# Patient Record
Sex: Female | Born: 1945 | Race: White | Hispanic: No
Health system: Southern US, Community
[De-identification: ages and names within clinical notes are randomized; demographics above are authoritative.]

## PROBLEM LIST (undated history)

## (undated) DIAGNOSIS — I4891 Unspecified atrial fibrillation: Secondary | ICD-10-CM

## (undated) DIAGNOSIS — J449 Chronic obstructive pulmonary disease, unspecified: Secondary | ICD-10-CM

## (undated) DIAGNOSIS — I10 Essential (primary) hypertension: Secondary | ICD-10-CM

## (undated) DIAGNOSIS — J441 Chronic obstructive pulmonary disease with (acute) exacerbation: Secondary | ICD-10-CM

## (undated) DIAGNOSIS — M797 Fibromyalgia: Secondary | ICD-10-CM

## (undated) DIAGNOSIS — M549 Dorsalgia, unspecified: Secondary | ICD-10-CM

## (undated) DIAGNOSIS — K219 Gastro-esophageal reflux disease without esophagitis: Secondary | ICD-10-CM

## (undated) DIAGNOSIS — E039 Hypothyroidism, unspecified: Secondary | ICD-10-CM

## (undated) HISTORY — PX: HYSTERECTOMY: SHX81

## (undated) HISTORY — PX: BACK SURGERY: SHX140

## (undated) HISTORY — DX: Fibromyalgia: M79.7

## (undated) HISTORY — DX: Dorsalgia, unspecified: M54.9

## (undated) HISTORY — PX: NECK SURGERY: SHX720

## (undated) HISTORY — PX: THYROID SURGERY: SHX805

## (undated) HISTORY — DX: Chronic obstructive pulmonary disease with (acute) exacerbation: J44.1

## (undated) HISTORY — DX: Essential (primary) hypertension: I10

## (undated) HISTORY — DX: Hypothyroidism, unspecified: E03.9

## (undated) HISTORY — PX: CHOLECYSTECTOMY: SHX55

## (undated) HISTORY — PX: OTHER SURGICAL HISTORY: SHX169

## (undated) HISTORY — DX: Gastro-esophageal reflux disease without esophagitis: K21.9

---

## 2015-07-03 DIAGNOSIS — M79641 Pain in right hand: Secondary | ICD-10-CM | POA: Diagnosis not present

## 2015-07-03 DIAGNOSIS — M79642 Pain in left hand: Secondary | ICD-10-CM | POA: Diagnosis not present

## 2015-07-03 DIAGNOSIS — R69 Illness, unspecified: Secondary | ICD-10-CM | POA: Diagnosis not present

## 2015-07-03 DIAGNOSIS — M7071 Other bursitis of hip, right hip: Secondary | ICD-10-CM | POA: Diagnosis not present

## 2015-07-03 DIAGNOSIS — M7072 Other bursitis of hip, left hip: Secondary | ICD-10-CM | POA: Diagnosis not present

## 2015-07-03 DIAGNOSIS — M0579 Rheumatoid arthritis with rheumatoid factor of multiple sites without organ or systems involvement: Secondary | ICD-10-CM | POA: Diagnosis not present

## 2015-07-03 DIAGNOSIS — G629 Polyneuropathy, unspecified: Secondary | ICD-10-CM | POA: Diagnosis not present

## 2015-07-03 DIAGNOSIS — Z79899 Other long term (current) drug therapy: Secondary | ICD-10-CM | POA: Diagnosis not present

## 2015-07-03 DIAGNOSIS — E538 Deficiency of other specified B group vitamins: Secondary | ICD-10-CM | POA: Diagnosis not present

## 2015-07-03 DIAGNOSIS — M797 Fibromyalgia: Secondary | ICD-10-CM | POA: Diagnosis not present

## 2015-07-03 DIAGNOSIS — M064 Inflammatory polyarthropathy: Secondary | ICD-10-CM | POA: Diagnosis not present

## 2015-07-07 DIAGNOSIS — J069 Acute upper respiratory infection, unspecified: Secondary | ICD-10-CM | POA: Diagnosis not present

## 2015-07-07 DIAGNOSIS — H10501 Unspecified blepharoconjunctivitis, right eye: Secondary | ICD-10-CM | POA: Diagnosis not present

## 2015-07-31 DIAGNOSIS — R69 Illness, unspecified: Secondary | ICD-10-CM | POA: Diagnosis not present

## 2015-07-31 DIAGNOSIS — I2510 Atherosclerotic heart disease of native coronary artery without angina pectoris: Secondary | ICD-10-CM | POA: Diagnosis not present

## 2015-07-31 DIAGNOSIS — R1310 Dysphagia, unspecified: Secondary | ICD-10-CM | POA: Diagnosis not present

## 2015-07-31 DIAGNOSIS — K219 Gastro-esophageal reflux disease without esophagitis: Secondary | ICD-10-CM | POA: Diagnosis not present

## 2015-07-31 DIAGNOSIS — E039 Hypothyroidism, unspecified: Secondary | ICD-10-CM | POA: Diagnosis not present

## 2015-07-31 DIAGNOSIS — I10 Essential (primary) hypertension: Secondary | ICD-10-CM | POA: Diagnosis not present

## 2015-07-31 DIAGNOSIS — R52 Pain, unspecified: Secondary | ICD-10-CM | POA: Diagnosis not present

## 2015-07-31 DIAGNOSIS — E785 Hyperlipidemia, unspecified: Secondary | ICD-10-CM | POA: Diagnosis not present

## 2015-08-05 DIAGNOSIS — I350 Nonrheumatic aortic (valve) stenosis: Secondary | ICD-10-CM | POA: Diagnosis not present

## 2015-08-05 DIAGNOSIS — J42 Unspecified chronic bronchitis: Secondary | ICD-10-CM | POA: Diagnosis not present

## 2015-08-05 DIAGNOSIS — R0609 Other forms of dyspnea: Secondary | ICD-10-CM | POA: Diagnosis not present

## 2015-08-05 DIAGNOSIS — R072 Precordial pain: Secondary | ICD-10-CM | POA: Diagnosis not present

## 2015-08-05 DIAGNOSIS — I10 Essential (primary) hypertension: Secondary | ICD-10-CM | POA: Diagnosis not present

## 2015-08-05 DIAGNOSIS — E785 Hyperlipidemia, unspecified: Secondary | ICD-10-CM | POA: Diagnosis not present

## 2015-08-21 DIAGNOSIS — G894 Chronic pain syndrome: Secondary | ICD-10-CM | POA: Diagnosis not present

## 2015-08-21 DIAGNOSIS — J42 Unspecified chronic bronchitis: Secondary | ICD-10-CM | POA: Diagnosis not present

## 2015-08-21 DIAGNOSIS — R69 Illness, unspecified: Secondary | ICD-10-CM | POA: Diagnosis not present

## 2015-08-21 DIAGNOSIS — I350 Nonrheumatic aortic (valve) stenosis: Secondary | ICD-10-CM | POA: Diagnosis not present

## 2015-08-21 DIAGNOSIS — I10 Essential (primary) hypertension: Secondary | ICD-10-CM | POA: Diagnosis not present

## 2015-08-21 DIAGNOSIS — R072 Precordial pain: Secondary | ICD-10-CM | POA: Diagnosis not present

## 2015-08-21 DIAGNOSIS — I2510 Atherosclerotic heart disease of native coronary artery without angina pectoris: Secondary | ICD-10-CM | POA: Diagnosis not present

## 2015-08-21 DIAGNOSIS — R0609 Other forms of dyspnea: Secondary | ICD-10-CM | POA: Diagnosis not present

## 2015-09-03 DIAGNOSIS — M8580 Other specified disorders of bone density and structure, unspecified site: Secondary | ICD-10-CM | POA: Diagnosis not present

## 2015-09-03 DIAGNOSIS — M797 Fibromyalgia: Secondary | ICD-10-CM | POA: Diagnosis not present

## 2015-09-03 DIAGNOSIS — M0579 Rheumatoid arthritis with rheumatoid factor of multiple sites without organ or systems involvement: Secondary | ICD-10-CM | POA: Diagnosis not present

## 2015-09-03 DIAGNOSIS — E559 Vitamin D deficiency, unspecified: Secondary | ICD-10-CM | POA: Diagnosis not present

## 2015-09-03 DIAGNOSIS — Z79899 Other long term (current) drug therapy: Secondary | ICD-10-CM | POA: Diagnosis not present

## 2015-09-03 DIAGNOSIS — Z13820 Encounter for screening for osteoporosis: Secondary | ICD-10-CM | POA: Diagnosis not present

## 2015-09-18 DIAGNOSIS — I2510 Atherosclerotic heart disease of native coronary artery without angina pectoris: Secondary | ICD-10-CM | POA: Diagnosis not present

## 2015-09-18 DIAGNOSIS — J449 Chronic obstructive pulmonary disease, unspecified: Secondary | ICD-10-CM | POA: Diagnosis not present

## 2015-09-18 DIAGNOSIS — R69 Illness, unspecified: Secondary | ICD-10-CM | POA: Diagnosis not present

## 2015-09-18 DIAGNOSIS — M797 Fibromyalgia: Secondary | ICD-10-CM | POA: Diagnosis not present

## 2015-09-20 DIAGNOSIS — S0080XA Unspecified superficial injury of other part of head, initial encounter: Secondary | ICD-10-CM | POA: Diagnosis not present

## 2015-09-20 DIAGNOSIS — M25462 Effusion, left knee: Secondary | ICD-10-CM | POA: Diagnosis not present

## 2015-09-20 DIAGNOSIS — M542 Cervicalgia: Secondary | ICD-10-CM | POA: Diagnosis not present

## 2015-09-20 DIAGNOSIS — M546 Pain in thoracic spine: Secondary | ICD-10-CM | POA: Diagnosis not present

## 2015-09-20 DIAGNOSIS — S3993XA Unspecified injury of pelvis, initial encounter: Secondary | ICD-10-CM | POA: Diagnosis not present

## 2015-09-20 DIAGNOSIS — S3992XA Unspecified injury of lower back, initial encounter: Secondary | ICD-10-CM | POA: Diagnosis not present

## 2015-09-20 DIAGNOSIS — S8991XA Unspecified injury of right lower leg, initial encounter: Secondary | ICD-10-CM | POA: Diagnosis not present

## 2015-09-20 DIAGNOSIS — S0090XA Unspecified superficial injury of unspecified part of head, initial encounter: Secondary | ICD-10-CM | POA: Diagnosis not present

## 2015-09-20 DIAGNOSIS — S134XXA Sprain of ligaments of cervical spine, initial encounter: Secondary | ICD-10-CM | POA: Diagnosis not present

## 2015-09-20 DIAGNOSIS — S8001XA Contusion of right knee, initial encounter: Secondary | ICD-10-CM | POA: Diagnosis not present

## 2015-09-20 DIAGNOSIS — M545 Low back pain: Secondary | ICD-10-CM | POA: Diagnosis not present

## 2015-09-20 DIAGNOSIS — S0990XA Unspecified injury of head, initial encounter: Secondary | ICD-10-CM | POA: Diagnosis not present

## 2015-09-20 DIAGNOSIS — R5381 Other malaise: Secondary | ICD-10-CM | POA: Diagnosis not present

## 2015-09-20 DIAGNOSIS — R102 Pelvic and perineal pain: Secondary | ICD-10-CM | POA: Diagnosis not present

## 2015-09-20 DIAGNOSIS — T1490 Injury, unspecified: Secondary | ICD-10-CM | POA: Diagnosis not present

## 2015-09-20 DIAGNOSIS — K449 Diaphragmatic hernia without obstruction or gangrene: Secondary | ICD-10-CM | POA: Diagnosis not present

## 2015-09-20 DIAGNOSIS — S335XXA Sprain of ligaments of lumbar spine, initial encounter: Secondary | ICD-10-CM | POA: Diagnosis not present

## 2015-09-20 DIAGNOSIS — M25561 Pain in right knee: Secondary | ICD-10-CM | POA: Diagnosis not present

## 2015-09-20 DIAGNOSIS — S199XXA Unspecified injury of neck, initial encounter: Secondary | ICD-10-CM | POA: Diagnosis not present

## 2015-09-20 DIAGNOSIS — S8002XA Contusion of left knee, initial encounter: Secondary | ICD-10-CM | POA: Diagnosis not present

## 2015-09-22 DIAGNOSIS — M79641 Pain in right hand: Secondary | ICD-10-CM | POA: Diagnosis not present

## 2015-09-22 DIAGNOSIS — R5383 Other fatigue: Secondary | ICD-10-CM | POA: Diagnosis not present

## 2015-09-22 DIAGNOSIS — M79642 Pain in left hand: Secondary | ICD-10-CM | POA: Diagnosis not present

## 2015-09-22 DIAGNOSIS — Z79899 Other long term (current) drug therapy: Secondary | ICD-10-CM | POA: Diagnosis not present

## 2015-09-22 DIAGNOSIS — M0579 Rheumatoid arthritis with rheumatoid factor of multiple sites without organ or systems involvement: Secondary | ICD-10-CM | POA: Diagnosis not present

## 2015-09-22 DIAGNOSIS — M797 Fibromyalgia: Secondary | ICD-10-CM | POA: Diagnosis not present

## 2015-09-22 DIAGNOSIS — G629 Polyneuropathy, unspecified: Secondary | ICD-10-CM | POA: Diagnosis not present

## 2015-09-22 DIAGNOSIS — G2581 Restless legs syndrome: Secondary | ICD-10-CM | POA: Diagnosis not present

## 2015-09-22 DIAGNOSIS — E538 Deficiency of other specified B group vitamins: Secondary | ICD-10-CM | POA: Diagnosis not present

## 2015-09-22 DIAGNOSIS — D649 Anemia, unspecified: Secondary | ICD-10-CM | POA: Diagnosis not present

## 2015-09-22 DIAGNOSIS — R69 Illness, unspecified: Secondary | ICD-10-CM | POA: Diagnosis not present

## 2015-09-24 DIAGNOSIS — S060X9S Concussion with loss of consciousness of unspecified duration, sequela: Secondary | ICD-10-CM | POA: Diagnosis not present

## 2015-09-24 DIAGNOSIS — K440 Diaphragmatic hernia with obstruction, without gangrene: Secondary | ICD-10-CM | POA: Diagnosis not present

## 2015-09-24 DIAGNOSIS — I2510 Atherosclerotic heart disease of native coronary artery without angina pectoris: Secondary | ICD-10-CM | POA: Diagnosis not present

## 2015-09-24 DIAGNOSIS — K219 Gastro-esophageal reflux disease without esophagitis: Secondary | ICD-10-CM | POA: Diagnosis not present

## 2015-09-24 DIAGNOSIS — M546 Pain in thoracic spine: Secondary | ICD-10-CM | POA: Diagnosis not present

## 2015-09-24 DIAGNOSIS — M25561 Pain in right knee: Secondary | ICD-10-CM | POA: Diagnosis not present

## 2015-09-24 DIAGNOSIS — M542 Cervicalgia: Secondary | ICD-10-CM | POA: Diagnosis not present

## 2015-09-24 DIAGNOSIS — S0990XS Unspecified injury of head, sequela: Secondary | ICD-10-CM | POA: Diagnosis not present

## 2015-09-24 DIAGNOSIS — M545 Low back pain: Secondary | ICD-10-CM | POA: Diagnosis not present

## 2015-09-29 DIAGNOSIS — M542 Cervicalgia: Secondary | ICD-10-CM | POA: Diagnosis not present

## 2015-09-29 DIAGNOSIS — M545 Low back pain: Secondary | ICD-10-CM | POA: Diagnosis not present

## 2015-09-29 DIAGNOSIS — M546 Pain in thoracic spine: Secondary | ICD-10-CM | POA: Diagnosis not present

## 2015-09-29 DIAGNOSIS — M25561 Pain in right knee: Secondary | ICD-10-CM | POA: Diagnosis not present

## 2015-10-02 DIAGNOSIS — S060X9S Concussion with loss of consciousness of unspecified duration, sequela: Secondary | ICD-10-CM | POA: Diagnosis not present

## 2015-10-02 DIAGNOSIS — M542 Cervicalgia: Secondary | ICD-10-CM | POA: Diagnosis not present

## 2015-10-02 DIAGNOSIS — K219 Gastro-esophageal reflux disease without esophagitis: Secondary | ICD-10-CM | POA: Diagnosis not present

## 2015-10-02 DIAGNOSIS — M545 Low back pain: Secondary | ICD-10-CM | POA: Diagnosis not present

## 2015-10-03 DIAGNOSIS — M25561 Pain in right knee: Secondary | ICD-10-CM | POA: Diagnosis not present

## 2015-10-03 DIAGNOSIS — M546 Pain in thoracic spine: Secondary | ICD-10-CM | POA: Diagnosis not present

## 2015-10-03 DIAGNOSIS — M542 Cervicalgia: Secondary | ICD-10-CM | POA: Diagnosis not present

## 2015-10-03 DIAGNOSIS — M545 Low back pain: Secondary | ICD-10-CM | POA: Diagnosis not present

## 2015-10-06 DIAGNOSIS — M47812 Spondylosis without myelopathy or radiculopathy, cervical region: Secondary | ICD-10-CM | POA: Diagnosis not present

## 2015-10-06 DIAGNOSIS — M5030 Other cervical disc degeneration, unspecified cervical region: Secondary | ICD-10-CM | POA: Diagnosis not present

## 2015-10-06 DIAGNOSIS — S161XXA Strain of muscle, fascia and tendon at neck level, initial encounter: Secondary | ICD-10-CM | POA: Diagnosis not present

## 2015-10-06 DIAGNOSIS — M4727 Other spondylosis with radiculopathy, lumbosacral region: Secondary | ICD-10-CM | POA: Diagnosis not present

## 2015-10-06 DIAGNOSIS — Z9889 Other specified postprocedural states: Secondary | ICD-10-CM | POA: Diagnosis not present

## 2015-10-06 DIAGNOSIS — S39012A Strain of muscle, fascia and tendon of lower back, initial encounter: Secondary | ICD-10-CM | POA: Diagnosis not present

## 2015-10-06 DIAGNOSIS — M5137 Other intervertebral disc degeneration, lumbosacral region: Secondary | ICD-10-CM | POA: Diagnosis not present

## 2015-10-07 DIAGNOSIS — M545 Low back pain: Secondary | ICD-10-CM | POA: Diagnosis not present

## 2015-10-07 DIAGNOSIS — M546 Pain in thoracic spine: Secondary | ICD-10-CM | POA: Diagnosis not present

## 2015-10-07 DIAGNOSIS — M542 Cervicalgia: Secondary | ICD-10-CM | POA: Diagnosis not present

## 2015-10-07 DIAGNOSIS — M25561 Pain in right knee: Secondary | ICD-10-CM | POA: Diagnosis not present

## 2015-10-09 DIAGNOSIS — M546 Pain in thoracic spine: Secondary | ICD-10-CM | POA: Diagnosis not present

## 2015-10-09 DIAGNOSIS — M25561 Pain in right knee: Secondary | ICD-10-CM | POA: Diagnosis not present

## 2015-10-09 DIAGNOSIS — M542 Cervicalgia: Secondary | ICD-10-CM | POA: Diagnosis not present

## 2015-10-09 DIAGNOSIS — M545 Low back pain: Secondary | ICD-10-CM | POA: Diagnosis not present

## 2015-10-14 DIAGNOSIS — M545 Low back pain: Secondary | ICD-10-CM | POA: Diagnosis not present

## 2015-10-16 DIAGNOSIS — K219 Gastro-esophageal reflux disease without esophagitis: Secondary | ICD-10-CM | POA: Diagnosis not present

## 2015-10-16 DIAGNOSIS — K449 Diaphragmatic hernia without obstruction or gangrene: Secondary | ICD-10-CM | POA: Diagnosis not present

## 2015-10-16 DIAGNOSIS — Z86010 Personal history of colonic polyps: Secondary | ICD-10-CM | POA: Diagnosis not present

## 2015-10-16 DIAGNOSIS — J449 Chronic obstructive pulmonary disease, unspecified: Secondary | ICD-10-CM | POA: Diagnosis not present

## 2015-10-16 DIAGNOSIS — I10 Essential (primary) hypertension: Secondary | ICD-10-CM | POA: Diagnosis not present

## 2015-10-16 DIAGNOSIS — Z801 Family history of malignant neoplasm of trachea, bronchus and lung: Secondary | ICD-10-CM | POA: Diagnosis not present

## 2015-10-16 DIAGNOSIS — Z886 Allergy status to analgesic agent status: Secondary | ICD-10-CM | POA: Diagnosis not present

## 2015-10-16 DIAGNOSIS — E7800 Pure hypercholesterolemia, unspecified: Secondary | ICD-10-CM | POA: Diagnosis not present

## 2015-10-16 DIAGNOSIS — Z79899 Other long term (current) drug therapy: Secondary | ICD-10-CM | POA: Diagnosis not present

## 2015-10-20 DIAGNOSIS — M5030 Other cervical disc degeneration, unspecified cervical region: Secondary | ICD-10-CM | POA: Diagnosis not present

## 2015-10-20 DIAGNOSIS — S39012A Strain of muscle, fascia and tendon of lower back, initial encounter: Secondary | ICD-10-CM | POA: Diagnosis not present

## 2015-10-20 DIAGNOSIS — M5137 Other intervertebral disc degeneration, lumbosacral region: Secondary | ICD-10-CM | POA: Diagnosis not present

## 2015-10-20 DIAGNOSIS — R51 Headache: Secondary | ICD-10-CM | POA: Diagnosis not present

## 2015-10-20 DIAGNOSIS — M4727 Other spondylosis with radiculopathy, lumbosacral region: Secondary | ICD-10-CM | POA: Diagnosis not present

## 2015-10-20 DIAGNOSIS — M542 Cervicalgia: Secondary | ICD-10-CM | POA: Diagnosis not present

## 2015-10-20 DIAGNOSIS — M47812 Spondylosis without myelopathy or radiculopathy, cervical region: Secondary | ICD-10-CM | POA: Diagnosis not present

## 2015-10-20 DIAGNOSIS — S161XXA Strain of muscle, fascia and tendon at neck level, initial encounter: Secondary | ICD-10-CM | POA: Diagnosis not present

## 2015-10-20 DIAGNOSIS — M545 Low back pain: Secondary | ICD-10-CM | POA: Diagnosis not present

## 2015-10-22 DIAGNOSIS — M25612 Stiffness of left shoulder, not elsewhere classified: Secondary | ICD-10-CM | POA: Diagnosis not present

## 2015-10-22 DIAGNOSIS — M545 Low back pain: Secondary | ICD-10-CM | POA: Diagnosis not present

## 2015-10-22 DIAGNOSIS — M25561 Pain in right knee: Secondary | ICD-10-CM | POA: Diagnosis not present

## 2015-10-22 DIAGNOSIS — M546 Pain in thoracic spine: Secondary | ICD-10-CM | POA: Diagnosis not present

## 2015-10-23 DIAGNOSIS — M25561 Pain in right knee: Secondary | ICD-10-CM | POA: Diagnosis not present

## 2015-10-23 DIAGNOSIS — M25612 Stiffness of left shoulder, not elsewhere classified: Secondary | ICD-10-CM | POA: Diagnosis not present

## 2015-10-23 DIAGNOSIS — M546 Pain in thoracic spine: Secondary | ICD-10-CM | POA: Diagnosis not present

## 2015-10-23 DIAGNOSIS — M545 Low back pain: Secondary | ICD-10-CM | POA: Diagnosis not present

## 2015-10-28 DIAGNOSIS — M25561 Pain in right knee: Secondary | ICD-10-CM | POA: Diagnosis not present

## 2015-10-28 DIAGNOSIS — M25612 Stiffness of left shoulder, not elsewhere classified: Secondary | ICD-10-CM | POA: Diagnosis not present

## 2015-10-28 DIAGNOSIS — M546 Pain in thoracic spine: Secondary | ICD-10-CM | POA: Diagnosis not present

## 2015-10-28 DIAGNOSIS — M545 Low back pain: Secondary | ICD-10-CM | POA: Diagnosis not present

## 2015-11-03 DIAGNOSIS — R413 Other amnesia: Secondary | ICD-10-CM | POA: Diagnosis not present

## 2015-11-03 DIAGNOSIS — M542 Cervicalgia: Secondary | ICD-10-CM | POA: Diagnosis not present

## 2015-11-03 DIAGNOSIS — M545 Low back pain: Secondary | ICD-10-CM | POA: Diagnosis not present

## 2015-11-03 DIAGNOSIS — S0990XS Unspecified injury of head, sequela: Secondary | ICD-10-CM | POA: Diagnosis not present

## 2015-11-04 DIAGNOSIS — M25561 Pain in right knee: Secondary | ICD-10-CM | POA: Diagnosis not present

## 2015-11-04 DIAGNOSIS — M546 Pain in thoracic spine: Secondary | ICD-10-CM | POA: Diagnosis not present

## 2015-11-04 DIAGNOSIS — M545 Low back pain: Secondary | ICD-10-CM | POA: Diagnosis not present

## 2015-11-04 DIAGNOSIS — M25612 Stiffness of left shoulder, not elsewhere classified: Secondary | ICD-10-CM | POA: Diagnosis not present

## 2015-11-06 DIAGNOSIS — M542 Cervicalgia: Secondary | ICD-10-CM | POA: Diagnosis not present

## 2015-11-06 DIAGNOSIS — M25561 Pain in right knee: Secondary | ICD-10-CM | POA: Diagnosis not present

## 2015-11-06 DIAGNOSIS — M545 Low back pain: Secondary | ICD-10-CM | POA: Diagnosis not present

## 2015-11-06 DIAGNOSIS — M546 Pain in thoracic spine: Secondary | ICD-10-CM | POA: Diagnosis not present

## 2015-11-10 DIAGNOSIS — M545 Low back pain: Secondary | ICD-10-CM | POA: Diagnosis not present

## 2015-11-10 DIAGNOSIS — M25561 Pain in right knee: Secondary | ICD-10-CM | POA: Diagnosis not present

## 2015-11-10 DIAGNOSIS — M542 Cervicalgia: Secondary | ICD-10-CM | POA: Diagnosis not present

## 2015-11-10 DIAGNOSIS — M546 Pain in thoracic spine: Secondary | ICD-10-CM | POA: Diagnosis not present

## 2015-11-12 DIAGNOSIS — R51 Headache: Secondary | ICD-10-CM | POA: Diagnosis not present

## 2015-11-12 DIAGNOSIS — M542 Cervicalgia: Secondary | ICD-10-CM | POA: Diagnosis not present

## 2015-11-12 DIAGNOSIS — M546 Pain in thoracic spine: Secondary | ICD-10-CM | POA: Diagnosis not present

## 2015-11-12 DIAGNOSIS — R413 Other amnesia: Secondary | ICD-10-CM | POA: Diagnosis not present

## 2015-11-12 DIAGNOSIS — M545 Low back pain: Secondary | ICD-10-CM | POA: Diagnosis not present

## 2015-11-12 DIAGNOSIS — M25561 Pain in right knee: Secondary | ICD-10-CM | POA: Diagnosis not present

## 2015-11-12 DIAGNOSIS — S060X9A Concussion with loss of consciousness of unspecified duration, initial encounter: Secondary | ICD-10-CM | POA: Diagnosis not present

## 2015-11-13 DIAGNOSIS — K449 Diaphragmatic hernia without obstruction or gangrene: Secondary | ICD-10-CM | POA: Diagnosis not present

## 2015-11-19 DIAGNOSIS — E7800 Pure hypercholesterolemia, unspecified: Secondary | ICD-10-CM | POA: Diagnosis not present

## 2015-11-19 DIAGNOSIS — K219 Gastro-esophageal reflux disease without esophagitis: Secondary | ICD-10-CM | POA: Diagnosis not present

## 2015-11-19 DIAGNOSIS — M545 Low back pain: Secondary | ICD-10-CM | POA: Diagnosis not present

## 2015-11-19 DIAGNOSIS — M546 Pain in thoracic spine: Secondary | ICD-10-CM | POA: Diagnosis not present

## 2015-11-19 DIAGNOSIS — M542 Cervicalgia: Secondary | ICD-10-CM | POA: Diagnosis not present

## 2015-11-19 DIAGNOSIS — Z86010 Personal history of colonic polyps: Secondary | ICD-10-CM | POA: Diagnosis not present

## 2015-11-19 DIAGNOSIS — Z79899 Other long term (current) drug therapy: Secondary | ICD-10-CM | POA: Diagnosis not present

## 2015-11-19 DIAGNOSIS — J449 Chronic obstructive pulmonary disease, unspecified: Secondary | ICD-10-CM | POA: Diagnosis not present

## 2015-11-19 DIAGNOSIS — I10 Essential (primary) hypertension: Secondary | ICD-10-CM | POA: Diagnosis not present

## 2015-11-19 DIAGNOSIS — K449 Diaphragmatic hernia without obstruction or gangrene: Secondary | ICD-10-CM | POA: Diagnosis not present

## 2015-11-19 DIAGNOSIS — Z886 Allergy status to analgesic agent status: Secondary | ICD-10-CM | POA: Diagnosis not present

## 2015-11-19 DIAGNOSIS — Z01810 Encounter for preprocedural cardiovascular examination: Secondary | ICD-10-CM | POA: Diagnosis not present

## 2015-11-19 DIAGNOSIS — M25561 Pain in right knee: Secondary | ICD-10-CM | POA: Diagnosis not present

## 2015-11-20 DIAGNOSIS — M542 Cervicalgia: Secondary | ICD-10-CM | POA: Diagnosis not present

## 2015-11-20 DIAGNOSIS — M25561 Pain in right knee: Secondary | ICD-10-CM | POA: Diagnosis not present

## 2015-11-20 DIAGNOSIS — M545 Low back pain: Secondary | ICD-10-CM | POA: Diagnosis not present

## 2015-11-20 DIAGNOSIS — I2510 Atherosclerotic heart disease of native coronary artery without angina pectoris: Secondary | ICD-10-CM | POA: Diagnosis not present

## 2015-11-20 DIAGNOSIS — K449 Diaphragmatic hernia without obstruction or gangrene: Secondary | ICD-10-CM | POA: Diagnosis not present

## 2015-11-20 DIAGNOSIS — M546 Pain in thoracic spine: Secondary | ICD-10-CM | POA: Diagnosis not present

## 2015-11-21 DIAGNOSIS — I10 Essential (primary) hypertension: Secondary | ICD-10-CM | POA: Diagnosis not present

## 2015-11-21 DIAGNOSIS — K449 Diaphragmatic hernia without obstruction or gangrene: Secondary | ICD-10-CM | POA: Diagnosis not present

## 2015-11-21 DIAGNOSIS — J449 Chronic obstructive pulmonary disease, unspecified: Secondary | ICD-10-CM | POA: Diagnosis not present

## 2015-11-27 DIAGNOSIS — G4734 Idiopathic sleep related nonobstructive alveolar hypoventilation: Secondary | ICD-10-CM | POA: Diagnosis not present

## 2015-11-27 DIAGNOSIS — E669 Obesity, unspecified: Secondary | ICD-10-CM | POA: Diagnosis not present

## 2015-11-27 DIAGNOSIS — J986 Disorders of diaphragm: Secondary | ICD-10-CM | POA: Diagnosis not present

## 2015-11-27 DIAGNOSIS — J449 Chronic obstructive pulmonary disease, unspecified: Secondary | ICD-10-CM | POA: Diagnosis not present

## 2015-11-29 DIAGNOSIS — R69 Illness, unspecified: Secondary | ICD-10-CM | POA: Diagnosis not present

## 2015-11-29 DIAGNOSIS — R1110 Vomiting, unspecified: Secondary | ICD-10-CM | POA: Diagnosis not present

## 2015-11-29 DIAGNOSIS — I10 Essential (primary) hypertension: Secondary | ICD-10-CM | POA: Diagnosis not present

## 2015-11-29 DIAGNOSIS — M6281 Muscle weakness (generalized): Secondary | ICD-10-CM | POA: Diagnosis not present

## 2015-11-29 DIAGNOSIS — M797 Fibromyalgia: Secondary | ICD-10-CM | POA: Diagnosis not present

## 2015-11-29 DIAGNOSIS — J9611 Chronic respiratory failure with hypoxia: Secondary | ICD-10-CM | POA: Diagnosis not present

## 2015-11-29 DIAGNOSIS — R262 Difficulty in walking, not elsewhere classified: Secondary | ICD-10-CM | POA: Diagnosis not present

## 2015-11-29 DIAGNOSIS — K449 Diaphragmatic hernia without obstruction or gangrene: Secondary | ICD-10-CM | POA: Diagnosis not present

## 2015-11-29 DIAGNOSIS — Z48815 Encounter for surgical aftercare following surgery on the digestive system: Secondary | ICD-10-CM | POA: Diagnosis not present

## 2015-11-29 DIAGNOSIS — K598 Other specified functional intestinal disorders: Secondary | ICD-10-CM | POA: Diagnosis not present

## 2015-11-29 DIAGNOSIS — Q433 Congenital malformations of intestinal fixation: Secondary | ICD-10-CM | POA: Diagnosis not present

## 2015-11-29 DIAGNOSIS — R1013 Epigastric pain: Secondary | ICD-10-CM | POA: Diagnosis not present

## 2015-11-29 DIAGNOSIS — J449 Chronic obstructive pulmonary disease, unspecified: Secondary | ICD-10-CM | POA: Diagnosis not present

## 2015-11-29 DIAGNOSIS — E890 Postprocedural hypothyroidism: Secondary | ICD-10-CM | POA: Diagnosis not present

## 2015-11-29 DIAGNOSIS — E785 Hyperlipidemia, unspecified: Secondary | ICD-10-CM | POA: Diagnosis not present

## 2015-11-29 DIAGNOSIS — K440 Diaphragmatic hernia with obstruction, without gangrene: Secondary | ICD-10-CM | POA: Diagnosis not present

## 2015-11-29 DIAGNOSIS — R109 Unspecified abdominal pain: Secondary | ICD-10-CM | POA: Diagnosis not present

## 2015-11-29 DIAGNOSIS — E876 Hypokalemia: Secondary | ICD-10-CM | POA: Diagnosis not present

## 2015-11-29 DIAGNOSIS — K5669 Other intestinal obstruction: Secondary | ICD-10-CM | POA: Diagnosis not present

## 2015-11-29 DIAGNOSIS — J209 Acute bronchitis, unspecified: Secondary | ICD-10-CM | POA: Diagnosis not present

## 2015-11-29 DIAGNOSIS — K450 Other specified abdominal hernia with obstruction, without gangrene: Secondary | ICD-10-CM | POA: Diagnosis not present

## 2015-12-06 DIAGNOSIS — R262 Difficulty in walking, not elsewhere classified: Secondary | ICD-10-CM | POA: Diagnosis not present

## 2015-12-06 DIAGNOSIS — Z792 Long term (current) use of antibiotics: Secondary | ICD-10-CM | POA: Diagnosis not present

## 2015-12-06 DIAGNOSIS — E785 Hyperlipidemia, unspecified: Secondary | ICD-10-CM | POA: Diagnosis not present

## 2015-12-06 DIAGNOSIS — I10 Essential (primary) hypertension: Secondary | ICD-10-CM | POA: Diagnosis not present

## 2015-12-06 DIAGNOSIS — T814XXA Infection following a procedure, initial encounter: Secondary | ICD-10-CM | POA: Diagnosis not present

## 2015-12-06 DIAGNOSIS — M797 Fibromyalgia: Secondary | ICD-10-CM | POA: Diagnosis not present

## 2015-12-06 DIAGNOSIS — Z48815 Encounter for surgical aftercare following surgery on the digestive system: Secondary | ICD-10-CM | POA: Diagnosis not present

## 2015-12-06 DIAGNOSIS — J9611 Chronic respiratory failure with hypoxia: Secondary | ICD-10-CM | POA: Diagnosis not present

## 2015-12-06 DIAGNOSIS — J209 Acute bronchitis, unspecified: Secondary | ICD-10-CM | POA: Diagnosis not present

## 2015-12-06 DIAGNOSIS — J449 Chronic obstructive pulmonary disease, unspecified: Secondary | ICD-10-CM | POA: Diagnosis not present

## 2015-12-06 DIAGNOSIS — K450 Other specified abdominal hernia with obstruction, without gangrene: Secondary | ICD-10-CM | POA: Diagnosis not present

## 2015-12-06 DIAGNOSIS — K449 Diaphragmatic hernia without obstruction or gangrene: Secondary | ICD-10-CM | POA: Diagnosis not present

## 2015-12-06 DIAGNOSIS — M6281 Muscle weakness (generalized): Secondary | ICD-10-CM | POA: Diagnosis not present

## 2015-12-06 DIAGNOSIS — R69 Illness, unspecified: Secondary | ICD-10-CM | POA: Diagnosis not present

## 2015-12-06 DIAGNOSIS — K5660 Unspecified intestinal obstruction: Secondary | ICD-10-CM | POA: Diagnosis not present

## 2015-12-06 DIAGNOSIS — E039 Hypothyroidism, unspecified: Secondary | ICD-10-CM | POA: Diagnosis not present

## 2015-12-09 DIAGNOSIS — J449 Chronic obstructive pulmonary disease, unspecified: Secondary | ICD-10-CM | POA: Diagnosis not present

## 2015-12-09 DIAGNOSIS — E039 Hypothyroidism, unspecified: Secondary | ICD-10-CM | POA: Diagnosis not present

## 2015-12-09 DIAGNOSIS — I10 Essential (primary) hypertension: Secondary | ICD-10-CM | POA: Diagnosis not present

## 2015-12-09 DIAGNOSIS — R69 Illness, unspecified: Secondary | ICD-10-CM | POA: Diagnosis not present

## 2015-12-09 DIAGNOSIS — K5660 Unspecified intestinal obstruction: Secondary | ICD-10-CM | POA: Diagnosis not present

## 2015-12-09 DIAGNOSIS — E785 Hyperlipidemia, unspecified: Secondary | ICD-10-CM | POA: Diagnosis not present

## 2015-12-18 DIAGNOSIS — T814XXA Infection following a procedure, initial encounter: Secondary | ICD-10-CM | POA: Diagnosis not present

## 2015-12-18 DIAGNOSIS — Z792 Long term (current) use of antibiotics: Secondary | ICD-10-CM | POA: Diagnosis not present

## 2015-12-29 DIAGNOSIS — J449 Chronic obstructive pulmonary disease, unspecified: Secondary | ICD-10-CM | POA: Diagnosis not present

## 2015-12-29 DIAGNOSIS — J9611 Chronic respiratory failure with hypoxia: Secondary | ICD-10-CM | POA: Diagnosis not present

## 2015-12-29 DIAGNOSIS — Z48815 Encounter for surgical aftercare following surgery on the digestive system: Secondary | ICD-10-CM | POA: Diagnosis not present

## 2015-12-30 DIAGNOSIS — Z48815 Encounter for surgical aftercare following surgery on the digestive system: Secondary | ICD-10-CM | POA: Diagnosis not present

## 2015-12-30 DIAGNOSIS — T8189XD Other complications of procedures, not elsewhere classified, subsequent encounter: Secondary | ICD-10-CM | POA: Diagnosis not present

## 2015-12-30 DIAGNOSIS — M5030 Other cervical disc degeneration, unspecified cervical region: Secondary | ICD-10-CM | POA: Diagnosis not present

## 2015-12-30 DIAGNOSIS — J449 Chronic obstructive pulmonary disease, unspecified: Secondary | ICD-10-CM | POA: Diagnosis not present

## 2015-12-30 DIAGNOSIS — M797 Fibromyalgia: Secondary | ICD-10-CM | POA: Diagnosis not present

## 2015-12-30 DIAGNOSIS — I10 Essential (primary) hypertension: Secondary | ICD-10-CM | POA: Diagnosis not present

## 2015-12-30 DIAGNOSIS — R69 Illness, unspecified: Secondary | ICD-10-CM | POA: Diagnosis not present

## 2015-12-30 DIAGNOSIS — J9611 Chronic respiratory failure with hypoxia: Secondary | ICD-10-CM | POA: Diagnosis not present

## 2015-12-31 DIAGNOSIS — J449 Chronic obstructive pulmonary disease, unspecified: Secondary | ICD-10-CM | POA: Diagnosis not present

## 2015-12-31 DIAGNOSIS — J9611 Chronic respiratory failure with hypoxia: Secondary | ICD-10-CM | POA: Diagnosis not present

## 2015-12-31 DIAGNOSIS — M797 Fibromyalgia: Secondary | ICD-10-CM | POA: Diagnosis not present

## 2015-12-31 DIAGNOSIS — Z48815 Encounter for surgical aftercare following surgery on the digestive system: Secondary | ICD-10-CM | POA: Diagnosis not present

## 2015-12-31 DIAGNOSIS — I10 Essential (primary) hypertension: Secondary | ICD-10-CM | POA: Diagnosis not present

## 2015-12-31 DIAGNOSIS — M5030 Other cervical disc degeneration, unspecified cervical region: Secondary | ICD-10-CM | POA: Diagnosis not present

## 2016-01-01 DIAGNOSIS — S39012A Strain of muscle, fascia and tendon of lower back, initial encounter: Secondary | ICD-10-CM | POA: Diagnosis not present

## 2016-01-01 DIAGNOSIS — M4727 Other spondylosis with radiculopathy, lumbosacral region: Secondary | ICD-10-CM | POA: Diagnosis not present

## 2016-01-01 DIAGNOSIS — R69 Illness, unspecified: Secondary | ICD-10-CM | POA: Diagnosis not present

## 2016-01-01 DIAGNOSIS — S161XXA Strain of muscle, fascia and tendon at neck level, initial encounter: Secondary | ICD-10-CM | POA: Diagnosis not present

## 2016-01-01 DIAGNOSIS — M47812 Spondylosis without myelopathy or radiculopathy, cervical region: Secondary | ICD-10-CM | POA: Diagnosis not present

## 2016-01-02 DIAGNOSIS — E785 Hyperlipidemia, unspecified: Secondary | ICD-10-CM | POA: Diagnosis not present

## 2016-01-02 DIAGNOSIS — L0390 Cellulitis, unspecified: Secondary | ICD-10-CM | POA: Diagnosis not present

## 2016-01-02 DIAGNOSIS — Z48815 Encounter for surgical aftercare following surgery on the digestive system: Secondary | ICD-10-CM | POA: Diagnosis not present

## 2016-01-02 DIAGNOSIS — M542 Cervicalgia: Secondary | ICD-10-CM | POA: Diagnosis not present

## 2016-01-02 DIAGNOSIS — J9611 Chronic respiratory failure with hypoxia: Secondary | ICD-10-CM | POA: Diagnosis not present

## 2016-01-02 DIAGNOSIS — M797 Fibromyalgia: Secondary | ICD-10-CM | POA: Diagnosis not present

## 2016-01-02 DIAGNOSIS — J449 Chronic obstructive pulmonary disease, unspecified: Secondary | ICD-10-CM | POA: Diagnosis not present

## 2016-01-02 DIAGNOSIS — K449 Diaphragmatic hernia without obstruction or gangrene: Secondary | ICD-10-CM | POA: Diagnosis not present

## 2016-01-02 DIAGNOSIS — M5030 Other cervical disc degeneration, unspecified cervical region: Secondary | ICD-10-CM | POA: Diagnosis not present

## 2016-01-02 DIAGNOSIS — I10 Essential (primary) hypertension: Secondary | ICD-10-CM | POA: Diagnosis not present

## 2016-01-05 DIAGNOSIS — M5030 Other cervical disc degeneration, unspecified cervical region: Secondary | ICD-10-CM | POA: Diagnosis not present

## 2016-01-05 DIAGNOSIS — M797 Fibromyalgia: Secondary | ICD-10-CM | POA: Diagnosis not present

## 2016-01-05 DIAGNOSIS — Z48815 Encounter for surgical aftercare following surgery on the digestive system: Secondary | ICD-10-CM | POA: Diagnosis not present

## 2016-01-05 DIAGNOSIS — I10 Essential (primary) hypertension: Secondary | ICD-10-CM | POA: Diagnosis not present

## 2016-01-05 DIAGNOSIS — J9611 Chronic respiratory failure with hypoxia: Secondary | ICD-10-CM | POA: Diagnosis not present

## 2016-01-05 DIAGNOSIS — J449 Chronic obstructive pulmonary disease, unspecified: Secondary | ICD-10-CM | POA: Diagnosis not present

## 2016-01-06 DIAGNOSIS — M797 Fibromyalgia: Secondary | ICD-10-CM | POA: Diagnosis not present

## 2016-01-06 DIAGNOSIS — I10 Essential (primary) hypertension: Secondary | ICD-10-CM | POA: Diagnosis not present

## 2016-01-06 DIAGNOSIS — J9611 Chronic respiratory failure with hypoxia: Secondary | ICD-10-CM | POA: Diagnosis not present

## 2016-01-06 DIAGNOSIS — Z48815 Encounter for surgical aftercare following surgery on the digestive system: Secondary | ICD-10-CM | POA: Diagnosis not present

## 2016-01-06 DIAGNOSIS — J449 Chronic obstructive pulmonary disease, unspecified: Secondary | ICD-10-CM | POA: Diagnosis not present

## 2016-01-06 DIAGNOSIS — M5030 Other cervical disc degeneration, unspecified cervical region: Secondary | ICD-10-CM | POA: Diagnosis not present

## 2016-01-07 DIAGNOSIS — R69 Illness, unspecified: Secondary | ICD-10-CM | POA: Diagnosis not present

## 2016-01-07 DIAGNOSIS — M8580 Other specified disorders of bone density and structure, unspecified site: Secondary | ICD-10-CM | POA: Diagnosis not present

## 2016-01-07 DIAGNOSIS — Z79899 Other long term (current) drug therapy: Secondary | ICD-10-CM | POA: Diagnosis not present

## 2016-01-07 DIAGNOSIS — R5383 Other fatigue: Secondary | ICD-10-CM | POA: Diagnosis not present

## 2016-01-07 DIAGNOSIS — E538 Deficiency of other specified B group vitamins: Secondary | ICD-10-CM | POA: Diagnosis not present

## 2016-01-07 DIAGNOSIS — E559 Vitamin D deficiency, unspecified: Secondary | ICD-10-CM | POA: Diagnosis not present

## 2016-01-07 DIAGNOSIS — M0579 Rheumatoid arthritis with rheumatoid factor of multiple sites without organ or systems involvement: Secondary | ICD-10-CM | POA: Diagnosis not present

## 2016-01-07 DIAGNOSIS — G2581 Restless legs syndrome: Secondary | ICD-10-CM | POA: Diagnosis not present

## 2016-01-07 DIAGNOSIS — D649 Anemia, unspecified: Secondary | ICD-10-CM | POA: Diagnosis not present

## 2016-01-07 DIAGNOSIS — G629 Polyneuropathy, unspecified: Secondary | ICD-10-CM | POA: Diagnosis not present

## 2016-01-07 DIAGNOSIS — M797 Fibromyalgia: Secondary | ICD-10-CM | POA: Diagnosis not present

## 2016-01-08 DIAGNOSIS — I10 Essential (primary) hypertension: Secondary | ICD-10-CM | POA: Diagnosis not present

## 2016-01-08 DIAGNOSIS — J9611 Chronic respiratory failure with hypoxia: Secondary | ICD-10-CM | POA: Diagnosis not present

## 2016-01-08 DIAGNOSIS — J449 Chronic obstructive pulmonary disease, unspecified: Secondary | ICD-10-CM | POA: Diagnosis not present

## 2016-01-08 DIAGNOSIS — M797 Fibromyalgia: Secondary | ICD-10-CM | POA: Diagnosis not present

## 2016-01-08 DIAGNOSIS — M5030 Other cervical disc degeneration, unspecified cervical region: Secondary | ICD-10-CM | POA: Diagnosis not present

## 2016-01-08 DIAGNOSIS — Z48815 Encounter for surgical aftercare following surgery on the digestive system: Secondary | ICD-10-CM | POA: Diagnosis not present

## 2016-01-09 DIAGNOSIS — I10 Essential (primary) hypertension: Secondary | ICD-10-CM | POA: Diagnosis not present

## 2016-01-09 DIAGNOSIS — J449 Chronic obstructive pulmonary disease, unspecified: Secondary | ICD-10-CM | POA: Diagnosis not present

## 2016-01-09 DIAGNOSIS — J9611 Chronic respiratory failure with hypoxia: Secondary | ICD-10-CM | POA: Diagnosis not present

## 2016-01-09 DIAGNOSIS — M5030 Other cervical disc degeneration, unspecified cervical region: Secondary | ICD-10-CM | POA: Diagnosis not present

## 2016-01-09 DIAGNOSIS — M797 Fibromyalgia: Secondary | ICD-10-CM | POA: Diagnosis not present

## 2016-01-09 DIAGNOSIS — Z48815 Encounter for surgical aftercare following surgery on the digestive system: Secondary | ICD-10-CM | POA: Diagnosis not present

## 2016-01-12 DIAGNOSIS — M797 Fibromyalgia: Secondary | ICD-10-CM | POA: Diagnosis not present

## 2016-01-12 DIAGNOSIS — Z48815 Encounter for surgical aftercare following surgery on the digestive system: Secondary | ICD-10-CM | POA: Diagnosis not present

## 2016-01-12 DIAGNOSIS — J449 Chronic obstructive pulmonary disease, unspecified: Secondary | ICD-10-CM | POA: Diagnosis not present

## 2016-01-12 DIAGNOSIS — I10 Essential (primary) hypertension: Secondary | ICD-10-CM | POA: Diagnosis not present

## 2016-01-12 DIAGNOSIS — M5030 Other cervical disc degeneration, unspecified cervical region: Secondary | ICD-10-CM | POA: Diagnosis not present

## 2016-01-12 DIAGNOSIS — J9611 Chronic respiratory failure with hypoxia: Secondary | ICD-10-CM | POA: Diagnosis not present

## 2016-01-13 DIAGNOSIS — M797 Fibromyalgia: Secondary | ICD-10-CM | POA: Diagnosis not present

## 2016-01-13 DIAGNOSIS — I10 Essential (primary) hypertension: Secondary | ICD-10-CM | POA: Diagnosis not present

## 2016-01-13 DIAGNOSIS — Z48815 Encounter for surgical aftercare following surgery on the digestive system: Secondary | ICD-10-CM | POA: Diagnosis not present

## 2016-01-13 DIAGNOSIS — J9611 Chronic respiratory failure with hypoxia: Secondary | ICD-10-CM | POA: Diagnosis not present

## 2016-01-13 DIAGNOSIS — J449 Chronic obstructive pulmonary disease, unspecified: Secondary | ICD-10-CM | POA: Diagnosis not present

## 2016-01-13 DIAGNOSIS — M5030 Other cervical disc degeneration, unspecified cervical region: Secondary | ICD-10-CM | POA: Diagnosis not present

## 2016-01-14 DIAGNOSIS — I10 Essential (primary) hypertension: Secondary | ICD-10-CM | POA: Diagnosis not present

## 2016-01-14 DIAGNOSIS — J9611 Chronic respiratory failure with hypoxia: Secondary | ICD-10-CM | POA: Diagnosis not present

## 2016-01-14 DIAGNOSIS — Z48815 Encounter for surgical aftercare following surgery on the digestive system: Secondary | ICD-10-CM | POA: Diagnosis not present

## 2016-01-14 DIAGNOSIS — M5030 Other cervical disc degeneration, unspecified cervical region: Secondary | ICD-10-CM | POA: Diagnosis not present

## 2016-01-14 DIAGNOSIS — M797 Fibromyalgia: Secondary | ICD-10-CM | POA: Diagnosis not present

## 2016-01-14 DIAGNOSIS — J449 Chronic obstructive pulmonary disease, unspecified: Secondary | ICD-10-CM | POA: Diagnosis not present

## 2016-01-15 DIAGNOSIS — M7062 Trochanteric bursitis, left hip: Secondary | ICD-10-CM | POA: Diagnosis not present

## 2016-01-15 DIAGNOSIS — K449 Diaphragmatic hernia without obstruction or gangrene: Secondary | ICD-10-CM | POA: Diagnosis not present

## 2016-01-15 DIAGNOSIS — M542 Cervicalgia: Secondary | ICD-10-CM | POA: Diagnosis not present

## 2016-01-15 DIAGNOSIS — K219 Gastro-esophageal reflux disease without esophagitis: Secondary | ICD-10-CM | POA: Diagnosis not present

## 2016-01-15 DIAGNOSIS — I10 Essential (primary) hypertension: Secondary | ICD-10-CM | POA: Diagnosis not present

## 2016-01-15 DIAGNOSIS — E785 Hyperlipidemia, unspecified: Secondary | ICD-10-CM | POA: Diagnosis not present

## 2016-01-16 DIAGNOSIS — J9611 Chronic respiratory failure with hypoxia: Secondary | ICD-10-CM | POA: Diagnosis not present

## 2016-01-16 DIAGNOSIS — J449 Chronic obstructive pulmonary disease, unspecified: Secondary | ICD-10-CM | POA: Diagnosis not present

## 2016-01-16 DIAGNOSIS — I10 Essential (primary) hypertension: Secondary | ICD-10-CM | POA: Diagnosis not present

## 2016-01-16 DIAGNOSIS — M5030 Other cervical disc degeneration, unspecified cervical region: Secondary | ICD-10-CM | POA: Diagnosis not present

## 2016-01-16 DIAGNOSIS — Z48815 Encounter for surgical aftercare following surgery on the digestive system: Secondary | ICD-10-CM | POA: Diagnosis not present

## 2016-01-16 DIAGNOSIS — M797 Fibromyalgia: Secondary | ICD-10-CM | POA: Diagnosis not present

## 2016-01-20 DIAGNOSIS — J449 Chronic obstructive pulmonary disease, unspecified: Secondary | ICD-10-CM | POA: Diagnosis not present

## 2016-01-20 DIAGNOSIS — I10 Essential (primary) hypertension: Secondary | ICD-10-CM | POA: Diagnosis not present

## 2016-01-20 DIAGNOSIS — M797 Fibromyalgia: Secondary | ICD-10-CM | POA: Diagnosis not present

## 2016-01-20 DIAGNOSIS — J9611 Chronic respiratory failure with hypoxia: Secondary | ICD-10-CM | POA: Diagnosis not present

## 2016-01-20 DIAGNOSIS — M5030 Other cervical disc degeneration, unspecified cervical region: Secondary | ICD-10-CM | POA: Diagnosis not present

## 2016-01-20 DIAGNOSIS — Z48815 Encounter for surgical aftercare following surgery on the digestive system: Secondary | ICD-10-CM | POA: Diagnosis not present

## 2016-01-21 DIAGNOSIS — Z48815 Encounter for surgical aftercare following surgery on the digestive system: Secondary | ICD-10-CM | POA: Diagnosis not present

## 2016-01-21 DIAGNOSIS — I10 Essential (primary) hypertension: Secondary | ICD-10-CM | POA: Diagnosis not present

## 2016-01-21 DIAGNOSIS — J9611 Chronic respiratory failure with hypoxia: Secondary | ICD-10-CM | POA: Diagnosis not present

## 2016-01-21 DIAGNOSIS — M5030 Other cervical disc degeneration, unspecified cervical region: Secondary | ICD-10-CM | POA: Diagnosis not present

## 2016-01-21 DIAGNOSIS — J449 Chronic obstructive pulmonary disease, unspecified: Secondary | ICD-10-CM | POA: Diagnosis not present

## 2016-01-21 DIAGNOSIS — M797 Fibromyalgia: Secondary | ICD-10-CM | POA: Diagnosis not present

## 2016-01-22 DIAGNOSIS — Z48815 Encounter for surgical aftercare following surgery on the digestive system: Secondary | ICD-10-CM | POA: Diagnosis not present

## 2016-01-22 DIAGNOSIS — J9611 Chronic respiratory failure with hypoxia: Secondary | ICD-10-CM | POA: Diagnosis not present

## 2016-01-22 DIAGNOSIS — I10 Essential (primary) hypertension: Secondary | ICD-10-CM | POA: Diagnosis not present

## 2016-01-22 DIAGNOSIS — M5030 Other cervical disc degeneration, unspecified cervical region: Secondary | ICD-10-CM | POA: Diagnosis not present

## 2016-01-22 DIAGNOSIS — J449 Chronic obstructive pulmonary disease, unspecified: Secondary | ICD-10-CM | POA: Diagnosis not present

## 2016-01-22 DIAGNOSIS — M797 Fibromyalgia: Secondary | ICD-10-CM | POA: Diagnosis not present

## 2016-01-26 DIAGNOSIS — I10 Essential (primary) hypertension: Secondary | ICD-10-CM | POA: Diagnosis not present

## 2016-01-26 DIAGNOSIS — J449 Chronic obstructive pulmonary disease, unspecified: Secondary | ICD-10-CM | POA: Diagnosis not present

## 2016-01-26 DIAGNOSIS — M5030 Other cervical disc degeneration, unspecified cervical region: Secondary | ICD-10-CM | POA: Diagnosis not present

## 2016-01-26 DIAGNOSIS — J9611 Chronic respiratory failure with hypoxia: Secondary | ICD-10-CM | POA: Diagnosis not present

## 2016-01-26 DIAGNOSIS — M797 Fibromyalgia: Secondary | ICD-10-CM | POA: Diagnosis not present

## 2016-01-26 DIAGNOSIS — Z48815 Encounter for surgical aftercare following surgery on the digestive system: Secondary | ICD-10-CM | POA: Diagnosis not present

## 2016-01-28 DIAGNOSIS — Z48815 Encounter for surgical aftercare following surgery on the digestive system: Secondary | ICD-10-CM | POA: Diagnosis not present

## 2016-01-28 DIAGNOSIS — I10 Essential (primary) hypertension: Secondary | ICD-10-CM | POA: Diagnosis not present

## 2016-01-28 DIAGNOSIS — M797 Fibromyalgia: Secondary | ICD-10-CM | POA: Diagnosis not present

## 2016-01-28 DIAGNOSIS — J449 Chronic obstructive pulmonary disease, unspecified: Secondary | ICD-10-CM | POA: Diagnosis not present

## 2016-01-28 DIAGNOSIS — J9611 Chronic respiratory failure with hypoxia: Secondary | ICD-10-CM | POA: Diagnosis not present

## 2016-01-28 DIAGNOSIS — M5030 Other cervical disc degeneration, unspecified cervical region: Secondary | ICD-10-CM | POA: Diagnosis not present

## 2016-02-04 DIAGNOSIS — M5030 Other cervical disc degeneration, unspecified cervical region: Secondary | ICD-10-CM | POA: Diagnosis not present

## 2016-02-04 DIAGNOSIS — M797 Fibromyalgia: Secondary | ICD-10-CM | POA: Diagnosis not present

## 2016-02-04 DIAGNOSIS — Z48815 Encounter for surgical aftercare following surgery on the digestive system: Secondary | ICD-10-CM | POA: Diagnosis not present

## 2016-02-04 DIAGNOSIS — I10 Essential (primary) hypertension: Secondary | ICD-10-CM | POA: Diagnosis not present

## 2016-02-04 DIAGNOSIS — J449 Chronic obstructive pulmonary disease, unspecified: Secondary | ICD-10-CM | POA: Diagnosis not present

## 2016-02-04 DIAGNOSIS — J9611 Chronic respiratory failure with hypoxia: Secondary | ICD-10-CM | POA: Diagnosis not present

## 2016-02-05 DIAGNOSIS — Z23 Encounter for immunization: Secondary | ICD-10-CM | POA: Diagnosis not present

## 2016-02-09 DIAGNOSIS — Z4889 Encounter for other specified surgical aftercare: Secondary | ICD-10-CM | POA: Diagnosis not present

## 2016-02-10 DIAGNOSIS — M5030 Other cervical disc degeneration, unspecified cervical region: Secondary | ICD-10-CM | POA: Diagnosis not present

## 2016-02-10 DIAGNOSIS — Z48815 Encounter for surgical aftercare following surgery on the digestive system: Secondary | ICD-10-CM | POA: Diagnosis not present

## 2016-02-10 DIAGNOSIS — I10 Essential (primary) hypertension: Secondary | ICD-10-CM | POA: Diagnosis not present

## 2016-02-10 DIAGNOSIS — J449 Chronic obstructive pulmonary disease, unspecified: Secondary | ICD-10-CM | POA: Diagnosis not present

## 2016-02-10 DIAGNOSIS — J9611 Chronic respiratory failure with hypoxia: Secondary | ICD-10-CM | POA: Diagnosis not present

## 2016-02-10 DIAGNOSIS — M797 Fibromyalgia: Secondary | ICD-10-CM | POA: Diagnosis not present

## 2016-02-18 DIAGNOSIS — I10 Essential (primary) hypertension: Secondary | ICD-10-CM | POA: Diagnosis not present

## 2016-02-18 DIAGNOSIS — Z48815 Encounter for surgical aftercare following surgery on the digestive system: Secondary | ICD-10-CM | POA: Diagnosis not present

## 2016-02-18 DIAGNOSIS — M797 Fibromyalgia: Secondary | ICD-10-CM | POA: Diagnosis not present

## 2016-02-18 DIAGNOSIS — M5030 Other cervical disc degeneration, unspecified cervical region: Secondary | ICD-10-CM | POA: Diagnosis not present

## 2016-02-18 DIAGNOSIS — J9611 Chronic respiratory failure with hypoxia: Secondary | ICD-10-CM | POA: Diagnosis not present

## 2016-02-18 DIAGNOSIS — J449 Chronic obstructive pulmonary disease, unspecified: Secondary | ICD-10-CM | POA: Diagnosis not present

## 2016-02-25 DIAGNOSIS — M797 Fibromyalgia: Secondary | ICD-10-CM | POA: Diagnosis not present

## 2016-02-25 DIAGNOSIS — M5030 Other cervical disc degeneration, unspecified cervical region: Secondary | ICD-10-CM | POA: Diagnosis not present

## 2016-02-25 DIAGNOSIS — Z48815 Encounter for surgical aftercare following surgery on the digestive system: Secondary | ICD-10-CM | POA: Diagnosis not present

## 2016-02-25 DIAGNOSIS — J9611 Chronic respiratory failure with hypoxia: Secondary | ICD-10-CM | POA: Diagnosis not present

## 2016-02-25 DIAGNOSIS — I10 Essential (primary) hypertension: Secondary | ICD-10-CM | POA: Diagnosis not present

## 2016-02-25 DIAGNOSIS — J449 Chronic obstructive pulmonary disease, unspecified: Secondary | ICD-10-CM | POA: Diagnosis not present

## 2016-03-02 DIAGNOSIS — J449 Chronic obstructive pulmonary disease, unspecified: Secondary | ICD-10-CM | POA: Diagnosis not present

## 2016-03-02 DIAGNOSIS — I10 Essential (primary) hypertension: Secondary | ICD-10-CM | POA: Diagnosis not present

## 2016-03-02 DIAGNOSIS — R69 Illness, unspecified: Secondary | ICD-10-CM | POA: Diagnosis not present

## 2016-03-02 DIAGNOSIS — T8189XD Other complications of procedures, not elsewhere classified, subsequent encounter: Secondary | ICD-10-CM | POA: Diagnosis not present

## 2016-03-02 DIAGNOSIS — M5030 Other cervical disc degeneration, unspecified cervical region: Secondary | ICD-10-CM | POA: Diagnosis not present

## 2016-03-02 DIAGNOSIS — J9611 Chronic respiratory failure with hypoxia: Secondary | ICD-10-CM | POA: Diagnosis not present

## 2016-03-08 DIAGNOSIS — L0291 Cutaneous abscess, unspecified: Secondary | ICD-10-CM | POA: Diagnosis not present

## 2016-03-08 DIAGNOSIS — I10 Essential (primary) hypertension: Secondary | ICD-10-CM | POA: Diagnosis not present

## 2016-03-08 DIAGNOSIS — E039 Hypothyroidism, unspecified: Secondary | ICD-10-CM | POA: Diagnosis not present

## 2016-03-08 DIAGNOSIS — M545 Low back pain: Secondary | ICD-10-CM | POA: Diagnosis not present

## 2016-03-08 DIAGNOSIS — K449 Diaphragmatic hernia without obstruction or gangrene: Secondary | ICD-10-CM | POA: Diagnosis not present

## 2016-03-08 DIAGNOSIS — I2510 Atherosclerotic heart disease of native coronary artery without angina pectoris: Secondary | ICD-10-CM | POA: Diagnosis not present

## 2016-03-10 DIAGNOSIS — J449 Chronic obstructive pulmonary disease, unspecified: Secondary | ICD-10-CM | POA: Diagnosis not present

## 2016-03-10 DIAGNOSIS — M5030 Other cervical disc degeneration, unspecified cervical region: Secondary | ICD-10-CM | POA: Diagnosis not present

## 2016-03-10 DIAGNOSIS — I10 Essential (primary) hypertension: Secondary | ICD-10-CM | POA: Diagnosis not present

## 2016-03-10 DIAGNOSIS — J9611 Chronic respiratory failure with hypoxia: Secondary | ICD-10-CM | POA: Diagnosis not present

## 2016-03-10 DIAGNOSIS — R69 Illness, unspecified: Secondary | ICD-10-CM | POA: Diagnosis not present

## 2016-03-10 DIAGNOSIS — T8189XD Other complications of procedures, not elsewhere classified, subsequent encounter: Secondary | ICD-10-CM | POA: Diagnosis not present

## 2016-03-17 DIAGNOSIS — M5030 Other cervical disc degeneration, unspecified cervical region: Secondary | ICD-10-CM | POA: Diagnosis not present

## 2016-03-17 DIAGNOSIS — T8189XD Other complications of procedures, not elsewhere classified, subsequent encounter: Secondary | ICD-10-CM | POA: Diagnosis not present

## 2016-03-17 DIAGNOSIS — I10 Essential (primary) hypertension: Secondary | ICD-10-CM | POA: Diagnosis not present

## 2016-03-17 DIAGNOSIS — J9611 Chronic respiratory failure with hypoxia: Secondary | ICD-10-CM | POA: Diagnosis not present

## 2016-03-17 DIAGNOSIS — J449 Chronic obstructive pulmonary disease, unspecified: Secondary | ICD-10-CM | POA: Diagnosis not present

## 2016-03-17 DIAGNOSIS — R69 Illness, unspecified: Secondary | ICD-10-CM | POA: Diagnosis not present

## 2016-03-24 DIAGNOSIS — T8189XD Other complications of procedures, not elsewhere classified, subsequent encounter: Secondary | ICD-10-CM | POA: Diagnosis not present

## 2016-03-24 DIAGNOSIS — J9611 Chronic respiratory failure with hypoxia: Secondary | ICD-10-CM | POA: Diagnosis not present

## 2016-03-24 DIAGNOSIS — J449 Chronic obstructive pulmonary disease, unspecified: Secondary | ICD-10-CM | POA: Diagnosis not present

## 2016-03-24 DIAGNOSIS — I10 Essential (primary) hypertension: Secondary | ICD-10-CM | POA: Diagnosis not present

## 2016-03-24 DIAGNOSIS — M5030 Other cervical disc degeneration, unspecified cervical region: Secondary | ICD-10-CM | POA: Diagnosis not present

## 2016-03-24 DIAGNOSIS — R69 Illness, unspecified: Secondary | ICD-10-CM | POA: Diagnosis not present

## 2016-04-01 DIAGNOSIS — S39012A Strain of muscle, fascia and tendon of lower back, initial encounter: Secondary | ICD-10-CM | POA: Diagnosis not present

## 2016-04-01 DIAGNOSIS — S161XXA Strain of muscle, fascia and tendon at neck level, initial encounter: Secondary | ICD-10-CM | POA: Diagnosis not present

## 2016-04-01 DIAGNOSIS — Z9889 Other specified postprocedural states: Secondary | ICD-10-CM | POA: Diagnosis not present

## 2016-04-01 DIAGNOSIS — M4727 Other spondylosis with radiculopathy, lumbosacral region: Secondary | ICD-10-CM | POA: Diagnosis not present

## 2016-04-01 DIAGNOSIS — M47812 Spondylosis without myelopathy or radiculopathy, cervical region: Secondary | ICD-10-CM | POA: Diagnosis not present

## 2016-04-02 DIAGNOSIS — T8189XD Other complications of procedures, not elsewhere classified, subsequent encounter: Secondary | ICD-10-CM | POA: Diagnosis not present

## 2016-04-02 DIAGNOSIS — M5030 Other cervical disc degeneration, unspecified cervical region: Secondary | ICD-10-CM | POA: Diagnosis not present

## 2016-04-02 DIAGNOSIS — J449 Chronic obstructive pulmonary disease, unspecified: Secondary | ICD-10-CM | POA: Diagnosis not present

## 2016-04-02 DIAGNOSIS — J9611 Chronic respiratory failure with hypoxia: Secondary | ICD-10-CM | POA: Diagnosis not present

## 2016-04-02 DIAGNOSIS — I10 Essential (primary) hypertension: Secondary | ICD-10-CM | POA: Diagnosis not present

## 2016-04-02 DIAGNOSIS — R69 Illness, unspecified: Secondary | ICD-10-CM | POA: Diagnosis not present

## 2016-04-06 DIAGNOSIS — J42 Unspecified chronic bronchitis: Secondary | ICD-10-CM | POA: Diagnosis not present

## 2016-04-06 DIAGNOSIS — I350 Nonrheumatic aortic (valve) stenosis: Secondary | ICD-10-CM | POA: Diagnosis not present

## 2016-04-06 DIAGNOSIS — I480 Paroxysmal atrial fibrillation: Secondary | ICD-10-CM | POA: Diagnosis not present

## 2016-04-06 DIAGNOSIS — E785 Hyperlipidemia, unspecified: Secondary | ICD-10-CM | POA: Diagnosis not present

## 2016-04-07 DIAGNOSIS — J9611 Chronic respiratory failure with hypoxia: Secondary | ICD-10-CM | POA: Diagnosis not present

## 2016-04-07 DIAGNOSIS — R69 Illness, unspecified: Secondary | ICD-10-CM | POA: Diagnosis not present

## 2016-04-07 DIAGNOSIS — T8189XD Other complications of procedures, not elsewhere classified, subsequent encounter: Secondary | ICD-10-CM | POA: Diagnosis not present

## 2016-04-07 DIAGNOSIS — I10 Essential (primary) hypertension: Secondary | ICD-10-CM | POA: Diagnosis not present

## 2016-04-07 DIAGNOSIS — J449 Chronic obstructive pulmonary disease, unspecified: Secondary | ICD-10-CM | POA: Diagnosis not present

## 2016-04-07 DIAGNOSIS — M5030 Other cervical disc degeneration, unspecified cervical region: Secondary | ICD-10-CM | POA: Diagnosis not present

## 2016-04-12 DIAGNOSIS — I2510 Atherosclerotic heart disease of native coronary artery without angina pectoris: Secondary | ICD-10-CM | POA: Diagnosis not present

## 2016-04-12 DIAGNOSIS — L0291 Cutaneous abscess, unspecified: Secondary | ICD-10-CM | POA: Diagnosis not present

## 2016-04-12 DIAGNOSIS — E785 Hyperlipidemia, unspecified: Secondary | ICD-10-CM | POA: Diagnosis not present

## 2016-04-12 DIAGNOSIS — I10 Essential (primary) hypertension: Secondary | ICD-10-CM | POA: Diagnosis not present

## 2016-04-12 DIAGNOSIS — M549 Dorsalgia, unspecified: Secondary | ICD-10-CM | POA: Diagnosis not present

## 2016-04-12 DIAGNOSIS — K449 Diaphragmatic hernia without obstruction or gangrene: Secondary | ICD-10-CM | POA: Diagnosis not present

## 2016-04-13 DIAGNOSIS — M47812 Spondylosis without myelopathy or radiculopathy, cervical region: Secondary | ICD-10-CM | POA: Diagnosis not present

## 2016-04-13 DIAGNOSIS — M4727 Other spondylosis with radiculopathy, lumbosacral region: Secondary | ICD-10-CM | POA: Diagnosis not present

## 2016-04-13 DIAGNOSIS — M542 Cervicalgia: Secondary | ICD-10-CM | POA: Diagnosis not present

## 2016-04-13 DIAGNOSIS — Z9889 Other specified postprocedural states: Secondary | ICD-10-CM | POA: Diagnosis not present

## 2016-04-13 DIAGNOSIS — M545 Low back pain: Secondary | ICD-10-CM | POA: Diagnosis not present

## 2016-04-14 DIAGNOSIS — G2581 Restless legs syndrome: Secondary | ICD-10-CM | POA: Diagnosis not present

## 2016-04-14 DIAGNOSIS — E538 Deficiency of other specified B group vitamins: Secondary | ICD-10-CM | POA: Diagnosis not present

## 2016-04-14 DIAGNOSIS — Z79899 Other long term (current) drug therapy: Secondary | ICD-10-CM | POA: Diagnosis not present

## 2016-04-14 DIAGNOSIS — E559 Vitamin D deficiency, unspecified: Secondary | ICD-10-CM | POA: Diagnosis not present

## 2016-04-14 DIAGNOSIS — J9611 Chronic respiratory failure with hypoxia: Secondary | ICD-10-CM | POA: Diagnosis not present

## 2016-04-14 DIAGNOSIS — M064 Inflammatory polyarthropathy: Secondary | ICD-10-CM | POA: Diagnosis not present

## 2016-04-14 DIAGNOSIS — I10 Essential (primary) hypertension: Secondary | ICD-10-CM | POA: Diagnosis not present

## 2016-04-14 DIAGNOSIS — R69 Illness, unspecified: Secondary | ICD-10-CM | POA: Diagnosis not present

## 2016-04-14 DIAGNOSIS — M0579 Rheumatoid arthritis with rheumatoid factor of multiple sites without organ or systems involvement: Secondary | ICD-10-CM | POA: Diagnosis not present

## 2016-04-14 DIAGNOSIS — J449 Chronic obstructive pulmonary disease, unspecified: Secondary | ICD-10-CM | POA: Diagnosis not present

## 2016-04-14 DIAGNOSIS — M8580 Other specified disorders of bone density and structure, unspecified site: Secondary | ICD-10-CM | POA: Diagnosis not present

## 2016-04-14 DIAGNOSIS — T8189XD Other complications of procedures, not elsewhere classified, subsequent encounter: Secondary | ICD-10-CM | POA: Diagnosis not present

## 2016-04-14 DIAGNOSIS — M5030 Other cervical disc degeneration, unspecified cervical region: Secondary | ICD-10-CM | POA: Diagnosis not present

## 2016-04-14 DIAGNOSIS — M797 Fibromyalgia: Secondary | ICD-10-CM | POA: Diagnosis not present

## 2016-04-16 DIAGNOSIS — S31105A Unspecified open wound of abdominal wall, periumbilic region without penetration into peritoneal cavity, initial encounter: Secondary | ICD-10-CM | POA: Diagnosis not present

## 2016-04-16 DIAGNOSIS — T814XXD Infection following a procedure, subsequent encounter: Secondary | ICD-10-CM | POA: Diagnosis not present

## 2016-04-16 DIAGNOSIS — L988 Other specified disorders of the skin and subcutaneous tissue: Secondary | ICD-10-CM | POA: Diagnosis not present

## 2016-04-20 DIAGNOSIS — M4727 Other spondylosis with radiculopathy, lumbosacral region: Secondary | ICD-10-CM | POA: Diagnosis not present

## 2016-04-20 DIAGNOSIS — Z9889 Other specified postprocedural states: Secondary | ICD-10-CM | POA: Diagnosis not present

## 2016-04-20 DIAGNOSIS — M47812 Spondylosis without myelopathy or radiculopathy, cervical region: Secondary | ICD-10-CM | POA: Diagnosis not present

## 2016-04-20 DIAGNOSIS — M545 Low back pain: Secondary | ICD-10-CM | POA: Diagnosis not present

## 2016-04-20 DIAGNOSIS — M542 Cervicalgia: Secondary | ICD-10-CM | POA: Diagnosis not present

## 2016-04-22 DIAGNOSIS — M545 Low back pain: Secondary | ICD-10-CM | POA: Diagnosis not present

## 2016-04-22 DIAGNOSIS — M47812 Spondylosis without myelopathy or radiculopathy, cervical region: Secondary | ICD-10-CM | POA: Diagnosis not present

## 2016-04-22 DIAGNOSIS — M4727 Other spondylosis with radiculopathy, lumbosacral region: Secondary | ICD-10-CM | POA: Diagnosis not present

## 2016-04-22 DIAGNOSIS — Z9889 Other specified postprocedural states: Secondary | ICD-10-CM | POA: Diagnosis not present

## 2016-04-22 DIAGNOSIS — S39012D Strain of muscle, fascia and tendon of lower back, subsequent encounter: Secondary | ICD-10-CM | POA: Diagnosis not present

## 2016-04-22 DIAGNOSIS — M542 Cervicalgia: Secondary | ICD-10-CM | POA: Diagnosis not present

## 2016-04-27 DIAGNOSIS — M545 Low back pain: Secondary | ICD-10-CM | POA: Diagnosis not present

## 2016-04-27 DIAGNOSIS — M47812 Spondylosis without myelopathy or radiculopathy, cervical region: Secondary | ICD-10-CM | POA: Diagnosis not present

## 2016-04-27 DIAGNOSIS — M4727 Other spondylosis with radiculopathy, lumbosacral region: Secondary | ICD-10-CM | POA: Diagnosis not present

## 2016-04-27 DIAGNOSIS — M542 Cervicalgia: Secondary | ICD-10-CM | POA: Diagnosis not present

## 2016-04-29 DIAGNOSIS — M4727 Other spondylosis with radiculopathy, lumbosacral region: Secondary | ICD-10-CM | POA: Diagnosis not present

## 2016-04-29 DIAGNOSIS — M542 Cervicalgia: Secondary | ICD-10-CM | POA: Diagnosis not present

## 2016-04-29 DIAGNOSIS — M545 Low back pain: Secondary | ICD-10-CM | POA: Diagnosis not present

## 2016-04-29 DIAGNOSIS — M47812 Spondylosis without myelopathy or radiculopathy, cervical region: Secondary | ICD-10-CM | POA: Diagnosis not present

## 2016-05-04 DIAGNOSIS — M4727 Other spondylosis with radiculopathy, lumbosacral region: Secondary | ICD-10-CM | POA: Diagnosis not present

## 2016-05-04 DIAGNOSIS — M542 Cervicalgia: Secondary | ICD-10-CM | POA: Diagnosis not present

## 2016-05-04 DIAGNOSIS — M47812 Spondylosis without myelopathy or radiculopathy, cervical region: Secondary | ICD-10-CM | POA: Diagnosis not present

## 2016-05-04 DIAGNOSIS — M545 Low back pain: Secondary | ICD-10-CM | POA: Diagnosis not present

## 2016-05-11 DIAGNOSIS — S39012D Strain of muscle, fascia and tendon of lower back, subsequent encounter: Secondary | ICD-10-CM | POA: Diagnosis not present

## 2016-05-11 DIAGNOSIS — M545 Low back pain: Secondary | ICD-10-CM | POA: Diagnosis not present

## 2016-05-11 DIAGNOSIS — M47812 Spondylosis without myelopathy or radiculopathy, cervical region: Secondary | ICD-10-CM | POA: Diagnosis not present

## 2016-05-11 DIAGNOSIS — M4727 Other spondylosis with radiculopathy, lumbosacral region: Secondary | ICD-10-CM | POA: Diagnosis not present

## 2016-05-11 DIAGNOSIS — M542 Cervicalgia: Secondary | ICD-10-CM | POA: Diagnosis not present

## 2016-05-11 DIAGNOSIS — Z9889 Other specified postprocedural states: Secondary | ICD-10-CM | POA: Diagnosis not present

## 2016-05-13 DIAGNOSIS — I10 Essential (primary) hypertension: Secondary | ICD-10-CM | POA: Diagnosis not present

## 2016-05-13 DIAGNOSIS — M5416 Radiculopathy, lumbar region: Secondary | ICD-10-CM | POA: Diagnosis not present

## 2016-05-13 DIAGNOSIS — M4727 Other spondylosis with radiculopathy, lumbosacral region: Secondary | ICD-10-CM | POA: Diagnosis not present

## 2016-05-13 DIAGNOSIS — M5136 Other intervertebral disc degeneration, lumbar region: Secondary | ICD-10-CM | POA: Diagnosis not present

## 2016-05-13 DIAGNOSIS — L0291 Cutaneous abscess, unspecified: Secondary | ICD-10-CM | POA: Diagnosis not present

## 2016-05-13 DIAGNOSIS — E785 Hyperlipidemia, unspecified: Secondary | ICD-10-CM | POA: Diagnosis not present

## 2016-05-13 DIAGNOSIS — E039 Hypothyroidism, unspecified: Secondary | ICD-10-CM | POA: Diagnosis not present

## 2016-05-13 DIAGNOSIS — T814XXD Infection following a procedure, subsequent encounter: Secondary | ICD-10-CM | POA: Diagnosis not present

## 2016-05-13 DIAGNOSIS — S39012A Strain of muscle, fascia and tendon of lower back, initial encounter: Secondary | ICD-10-CM | POA: Diagnosis not present

## 2016-05-13 DIAGNOSIS — M5137 Other intervertebral disc degeneration, lumbosacral region: Secondary | ICD-10-CM | POA: Diagnosis not present

## 2016-05-26 DIAGNOSIS — T814XXD Infection following a procedure, subsequent encounter: Secondary | ICD-10-CM | POA: Diagnosis not present

## 2016-05-27 DIAGNOSIS — J449 Chronic obstructive pulmonary disease, unspecified: Secondary | ICD-10-CM | POA: Diagnosis not present

## 2016-05-27 DIAGNOSIS — E669 Obesity, unspecified: Secondary | ICD-10-CM | POA: Diagnosis not present

## 2016-05-27 DIAGNOSIS — J986 Disorders of diaphragm: Secondary | ICD-10-CM | POA: Diagnosis not present

## 2016-05-27 DIAGNOSIS — G4734 Idiopathic sleep related nonobstructive alveolar hypoventilation: Secondary | ICD-10-CM | POA: Diagnosis not present

## 2016-05-27 DIAGNOSIS — Z23 Encounter for immunization: Secondary | ICD-10-CM | POA: Diagnosis not present

## 2016-06-07 DIAGNOSIS — M4727 Other spondylosis with radiculopathy, lumbosacral region: Secondary | ICD-10-CM | POA: Diagnosis not present

## 2016-06-07 DIAGNOSIS — M5116 Intervertebral disc disorders with radiculopathy, lumbar region: Secondary | ICD-10-CM | POA: Diagnosis not present

## 2016-06-07 DIAGNOSIS — M5137 Other intervertebral disc degeneration, lumbosacral region: Secondary | ICD-10-CM | POA: Diagnosis not present

## 2016-06-07 DIAGNOSIS — S39012A Strain of muscle, fascia and tendon of lower back, initial encounter: Secondary | ICD-10-CM | POA: Diagnosis not present

## 2016-06-10 DIAGNOSIS — M5137 Other intervertebral disc degeneration, lumbosacral region: Secondary | ICD-10-CM | POA: Diagnosis not present

## 2016-06-10 DIAGNOSIS — S39012A Strain of muscle, fascia and tendon of lower back, initial encounter: Secondary | ICD-10-CM | POA: Diagnosis not present

## 2016-06-10 DIAGNOSIS — M4727 Other spondylosis with radiculopathy, lumbosacral region: Secondary | ICD-10-CM | POA: Diagnosis not present

## 2016-07-06 DIAGNOSIS — Z79899 Other long term (current) drug therapy: Secondary | ICD-10-CM | POA: Diagnosis not present

## 2016-07-06 DIAGNOSIS — M79672 Pain in left foot: Secondary | ICD-10-CM | POA: Diagnosis not present

## 2016-07-06 DIAGNOSIS — G2581 Restless legs syndrome: Secondary | ICD-10-CM | POA: Diagnosis not present

## 2016-07-06 DIAGNOSIS — E538 Deficiency of other specified B group vitamins: Secondary | ICD-10-CM | POA: Diagnosis not present

## 2016-07-06 DIAGNOSIS — R69 Illness, unspecified: Secondary | ICD-10-CM | POA: Diagnosis not present

## 2016-07-06 DIAGNOSIS — M79671 Pain in right foot: Secondary | ICD-10-CM | POA: Diagnosis not present

## 2016-07-06 DIAGNOSIS — G629 Polyneuropathy, unspecified: Secondary | ICD-10-CM | POA: Diagnosis not present

## 2016-07-06 DIAGNOSIS — E559 Vitamin D deficiency, unspecified: Secondary | ICD-10-CM | POA: Diagnosis not present

## 2016-07-06 DIAGNOSIS — M8580 Other specified disorders of bone density and structure, unspecified site: Secondary | ICD-10-CM | POA: Diagnosis not present

## 2016-07-06 DIAGNOSIS — M797 Fibromyalgia: Secondary | ICD-10-CM | POA: Diagnosis not present

## 2016-07-06 DIAGNOSIS — M0579 Rheumatoid arthritis with rheumatoid factor of multiple sites without organ or systems involvement: Secondary | ICD-10-CM | POA: Diagnosis not present

## 2016-07-07 DIAGNOSIS — T814XXD Infection following a procedure, subsequent encounter: Secondary | ICD-10-CM | POA: Diagnosis not present

## 2016-07-15 DIAGNOSIS — E782 Mixed hyperlipidemia: Secondary | ICD-10-CM | POA: Diagnosis not present

## 2016-07-15 DIAGNOSIS — R011 Cardiac murmur, unspecified: Secondary | ICD-10-CM | POA: Diagnosis not present

## 2016-07-15 DIAGNOSIS — M797 Fibromyalgia: Secondary | ICD-10-CM | POA: Diagnosis not present

## 2016-07-15 DIAGNOSIS — E039 Hypothyroidism, unspecified: Secondary | ICD-10-CM | POA: Diagnosis not present

## 2016-07-15 DIAGNOSIS — G8929 Other chronic pain: Secondary | ICD-10-CM | POA: Diagnosis not present

## 2016-07-15 DIAGNOSIS — I10 Essential (primary) hypertension: Secondary | ICD-10-CM | POA: Diagnosis not present

## 2016-07-21 DIAGNOSIS — Z79899 Other long term (current) drug therapy: Secondary | ICD-10-CM | POA: Diagnosis not present

## 2016-07-21 DIAGNOSIS — M545 Low back pain: Secondary | ICD-10-CM | POA: Diagnosis not present

## 2016-07-21 DIAGNOSIS — M5416 Radiculopathy, lumbar region: Secondary | ICD-10-CM | POA: Diagnosis not present

## 2016-08-13 DIAGNOSIS — G4733 Obstructive sleep apnea (adult) (pediatric): Secondary | ICD-10-CM | POA: Diagnosis not present

## 2016-08-17 DIAGNOSIS — M5416 Radiculopathy, lumbar region: Secondary | ICD-10-CM | POA: Diagnosis not present

## 2016-08-17 DIAGNOSIS — M545 Low back pain: Secondary | ICD-10-CM | POA: Diagnosis not present

## 2016-08-21 DIAGNOSIS — J019 Acute sinusitis, unspecified: Secondary | ICD-10-CM | POA: Diagnosis not present

## 2016-08-25 DIAGNOSIS — M4727 Other spondylosis with radiculopathy, lumbosacral region: Secondary | ICD-10-CM | POA: Diagnosis not present

## 2016-08-25 DIAGNOSIS — S39012D Strain of muscle, fascia and tendon of lower back, subsequent encounter: Secondary | ICD-10-CM | POA: Diagnosis not present

## 2016-08-25 DIAGNOSIS — M5137 Other intervertebral disc degeneration, lumbosacral region: Secondary | ICD-10-CM | POA: Diagnosis not present

## 2016-08-31 DIAGNOSIS — S39012D Strain of muscle, fascia and tendon of lower back, subsequent encounter: Secondary | ICD-10-CM | POA: Diagnosis not present

## 2016-08-31 DIAGNOSIS — M5137 Other intervertebral disc degeneration, lumbosacral region: Secondary | ICD-10-CM | POA: Diagnosis not present

## 2016-08-31 DIAGNOSIS — M4727 Other spondylosis with radiculopathy, lumbosacral region: Secondary | ICD-10-CM | POA: Diagnosis not present

## 2016-09-02 DIAGNOSIS — M4727 Other spondylosis with radiculopathy, lumbosacral region: Secondary | ICD-10-CM | POA: Diagnosis not present

## 2016-09-02 DIAGNOSIS — M5137 Other intervertebral disc degeneration, lumbosacral region: Secondary | ICD-10-CM | POA: Diagnosis not present

## 2016-09-02 DIAGNOSIS — S39012D Strain of muscle, fascia and tendon of lower back, subsequent encounter: Secondary | ICD-10-CM | POA: Diagnosis not present

## 2016-09-03 DIAGNOSIS — H10011 Acute follicular conjunctivitis, right eye: Secondary | ICD-10-CM | POA: Diagnosis not present

## 2016-09-03 DIAGNOSIS — H00011 Hordeolum externum right upper eyelid: Secondary | ICD-10-CM | POA: Diagnosis not present

## 2016-09-06 DIAGNOSIS — M5416 Radiculopathy, lumbar region: Secondary | ICD-10-CM | POA: Diagnosis not present

## 2016-09-06 DIAGNOSIS — M545 Low back pain: Secondary | ICD-10-CM | POA: Diagnosis not present

## 2016-09-08 DIAGNOSIS — M5137 Other intervertebral disc degeneration, lumbosacral region: Secondary | ICD-10-CM | POA: Diagnosis not present

## 2016-09-08 DIAGNOSIS — M4727 Other spondylosis with radiculopathy, lumbosacral region: Secondary | ICD-10-CM | POA: Diagnosis not present

## 2016-09-08 DIAGNOSIS — S39012D Strain of muscle, fascia and tendon of lower back, subsequent encounter: Secondary | ICD-10-CM | POA: Diagnosis not present

## 2016-09-10 DIAGNOSIS — S39012D Strain of muscle, fascia and tendon of lower back, subsequent encounter: Secondary | ICD-10-CM | POA: Diagnosis not present

## 2016-09-10 DIAGNOSIS — M5137 Other intervertebral disc degeneration, lumbosacral region: Secondary | ICD-10-CM | POA: Diagnosis not present

## 2016-09-10 DIAGNOSIS — M4727 Other spondylosis with radiculopathy, lumbosacral region: Secondary | ICD-10-CM | POA: Diagnosis not present

## 2016-09-13 DIAGNOSIS — G4733 Obstructive sleep apnea (adult) (pediatric): Secondary | ICD-10-CM | POA: Diagnosis not present

## 2016-09-14 DIAGNOSIS — M4727 Other spondylosis with radiculopathy, lumbosacral region: Secondary | ICD-10-CM | POA: Diagnosis not present

## 2016-09-14 DIAGNOSIS — S39012D Strain of muscle, fascia and tendon of lower back, subsequent encounter: Secondary | ICD-10-CM | POA: Diagnosis not present

## 2016-09-14 DIAGNOSIS — M5137 Other intervertebral disc degeneration, lumbosacral region: Secondary | ICD-10-CM | POA: Diagnosis not present

## 2016-09-17 DIAGNOSIS — M4727 Other spondylosis with radiculopathy, lumbosacral region: Secondary | ICD-10-CM | POA: Diagnosis not present

## 2016-09-17 DIAGNOSIS — M5137 Other intervertebral disc degeneration, lumbosacral region: Secondary | ICD-10-CM | POA: Diagnosis not present

## 2016-09-17 DIAGNOSIS — S39012D Strain of muscle, fascia and tendon of lower back, subsequent encounter: Secondary | ICD-10-CM | POA: Diagnosis not present

## 2016-09-21 DIAGNOSIS — M542 Cervicalgia: Secondary | ICD-10-CM | POA: Diagnosis not present

## 2016-09-21 DIAGNOSIS — M545 Low back pain: Secondary | ICD-10-CM | POA: Diagnosis not present

## 2016-09-21 DIAGNOSIS — R262 Difficulty in walking, not elsewhere classified: Secondary | ICD-10-CM | POA: Diagnosis not present

## 2016-09-21 DIAGNOSIS — M2560 Stiffness of unspecified joint, not elsewhere classified: Secondary | ICD-10-CM | POA: Diagnosis not present

## 2016-09-27 DIAGNOSIS — M5416 Radiculopathy, lumbar region: Secondary | ICD-10-CM | POA: Diagnosis not present

## 2016-09-28 DIAGNOSIS — M545 Low back pain: Secondary | ICD-10-CM | POA: Diagnosis not present

## 2016-09-28 DIAGNOSIS — M2560 Stiffness of unspecified joint, not elsewhere classified: Secondary | ICD-10-CM | POA: Diagnosis not present

## 2016-09-28 DIAGNOSIS — R262 Difficulty in walking, not elsewhere classified: Secondary | ICD-10-CM | POA: Diagnosis not present

## 2016-09-28 DIAGNOSIS — M542 Cervicalgia: Secondary | ICD-10-CM | POA: Diagnosis not present

## 2016-10-01 DIAGNOSIS — S39012D Strain of muscle, fascia and tendon of lower back, subsequent encounter: Secondary | ICD-10-CM | POA: Diagnosis not present

## 2016-10-01 DIAGNOSIS — M4727 Other spondylosis with radiculopathy, lumbosacral region: Secondary | ICD-10-CM | POA: Diagnosis not present

## 2016-10-01 DIAGNOSIS — M5137 Other intervertebral disc degeneration, lumbosacral region: Secondary | ICD-10-CM | POA: Diagnosis not present

## 2016-10-05 DIAGNOSIS — Z9889 Other specified postprocedural states: Secondary | ICD-10-CM | POA: Diagnosis not present

## 2016-10-05 DIAGNOSIS — S39012A Strain of muscle, fascia and tendon of lower back, initial encounter: Secondary | ICD-10-CM | POA: Diagnosis not present

## 2016-10-05 DIAGNOSIS — M542 Cervicalgia: Secondary | ICD-10-CM | POA: Diagnosis not present

## 2016-10-05 DIAGNOSIS — M545 Low back pain: Secondary | ICD-10-CM | POA: Diagnosis not present

## 2016-10-05 DIAGNOSIS — S161XXA Strain of muscle, fascia and tendon at neck level, initial encounter: Secondary | ICD-10-CM | POA: Diagnosis not present

## 2016-10-05 DIAGNOSIS — M47812 Spondylosis without myelopathy or radiculopathy, cervical region: Secondary | ICD-10-CM | POA: Diagnosis not present

## 2016-10-05 DIAGNOSIS — M2560 Stiffness of unspecified joint, not elsewhere classified: Secondary | ICD-10-CM | POA: Diagnosis not present

## 2016-10-05 DIAGNOSIS — M4727 Other spondylosis with radiculopathy, lumbosacral region: Secondary | ICD-10-CM | POA: Diagnosis not present

## 2016-10-05 DIAGNOSIS — R262 Difficulty in walking, not elsewhere classified: Secondary | ICD-10-CM | POA: Diagnosis not present

## 2016-10-05 DIAGNOSIS — M5137 Other intervertebral disc degeneration, lumbosacral region: Secondary | ICD-10-CM | POA: Diagnosis not present

## 2016-10-05 DIAGNOSIS — M5030 Other cervical disc degeneration, unspecified cervical region: Secondary | ICD-10-CM | POA: Diagnosis not present

## 2016-10-06 DIAGNOSIS — R262 Difficulty in walking, not elsewhere classified: Secondary | ICD-10-CM | POA: Diagnosis not present

## 2016-10-06 DIAGNOSIS — M545 Low back pain: Secondary | ICD-10-CM | POA: Diagnosis not present

## 2016-10-06 DIAGNOSIS — M542 Cervicalgia: Secondary | ICD-10-CM | POA: Diagnosis not present

## 2016-10-06 DIAGNOSIS — M2560 Stiffness of unspecified joint, not elsewhere classified: Secondary | ICD-10-CM | POA: Diagnosis not present

## 2016-10-13 DIAGNOSIS — R262 Difficulty in walking, not elsewhere classified: Secondary | ICD-10-CM | POA: Diagnosis not present

## 2016-10-13 DIAGNOSIS — M545 Low back pain: Secondary | ICD-10-CM | POA: Diagnosis not present

## 2016-10-13 DIAGNOSIS — G4733 Obstructive sleep apnea (adult) (pediatric): Secondary | ICD-10-CM | POA: Diagnosis not present

## 2016-10-13 DIAGNOSIS — M2560 Stiffness of unspecified joint, not elsewhere classified: Secondary | ICD-10-CM | POA: Diagnosis not present

## 2016-10-13 DIAGNOSIS — M542 Cervicalgia: Secondary | ICD-10-CM | POA: Diagnosis not present

## 2016-10-14 DIAGNOSIS — R69 Illness, unspecified: Secondary | ICD-10-CM | POA: Diagnosis not present

## 2016-10-14 DIAGNOSIS — I10 Essential (primary) hypertension: Secondary | ICD-10-CM | POA: Diagnosis not present

## 2016-10-14 DIAGNOSIS — E039 Hypothyroidism, unspecified: Secondary | ICD-10-CM | POA: Diagnosis not present

## 2016-10-14 DIAGNOSIS — E782 Mixed hyperlipidemia: Secondary | ICD-10-CM | POA: Diagnosis not present

## 2016-10-14 DIAGNOSIS — Z0000 Encounter for general adult medical examination without abnormal findings: Secondary | ICD-10-CM | POA: Diagnosis not present

## 2016-10-14 DIAGNOSIS — Z23 Encounter for immunization: Secondary | ICD-10-CM | POA: Diagnosis not present

## 2016-10-15 DIAGNOSIS — Z7982 Long term (current) use of aspirin: Secondary | ICD-10-CM | POA: Diagnosis not present

## 2016-10-15 DIAGNOSIS — E079 Disorder of thyroid, unspecified: Secondary | ICD-10-CM | POA: Diagnosis not present

## 2016-10-15 DIAGNOSIS — G4700 Insomnia, unspecified: Secondary | ICD-10-CM | POA: Diagnosis not present

## 2016-10-15 DIAGNOSIS — R262 Difficulty in walking, not elsewhere classified: Secondary | ICD-10-CM | POA: Diagnosis not present

## 2016-10-15 DIAGNOSIS — R69 Illness, unspecified: Secondary | ICD-10-CM | POA: Diagnosis not present

## 2016-10-15 DIAGNOSIS — M5442 Lumbago with sciatica, left side: Secondary | ICD-10-CM | POA: Diagnosis not present

## 2016-10-15 DIAGNOSIS — M545 Low back pain: Secondary | ICD-10-CM | POA: Diagnosis not present

## 2016-10-15 DIAGNOSIS — Z205 Contact with and (suspected) exposure to viral hepatitis: Secondary | ICD-10-CM | POA: Diagnosis not present

## 2016-10-15 DIAGNOSIS — R011 Cardiac murmur, unspecified: Secondary | ICD-10-CM | POA: Diagnosis not present

## 2016-10-15 DIAGNOSIS — E782 Mixed hyperlipidemia: Secondary | ICD-10-CM | POA: Diagnosis not present

## 2016-10-15 DIAGNOSIS — I10 Essential (primary) hypertension: Secondary | ICD-10-CM | POA: Diagnosis not present

## 2016-10-15 DIAGNOSIS — E669 Obesity, unspecified: Secondary | ICD-10-CM | POA: Diagnosis not present

## 2016-10-15 DIAGNOSIS — D519 Vitamin B12 deficiency anemia, unspecified: Secondary | ICD-10-CM | POA: Diagnosis not present

## 2016-10-15 DIAGNOSIS — Z7951 Long term (current) use of inhaled steroids: Secondary | ICD-10-CM | POA: Diagnosis not present

## 2016-10-15 DIAGNOSIS — Z79890 Hormone replacement therapy (postmenopausal): Secondary | ICD-10-CM | POA: Diagnosis not present

## 2016-10-15 DIAGNOSIS — Z6834 Body mass index (BMI) 34.0-34.9, adult: Secondary | ICD-10-CM | POA: Diagnosis not present

## 2016-10-15 DIAGNOSIS — M2560 Stiffness of unspecified joint, not elsewhere classified: Secondary | ICD-10-CM | POA: Diagnosis not present

## 2016-10-15 DIAGNOSIS — M797 Fibromyalgia: Secondary | ICD-10-CM | POA: Diagnosis not present

## 2016-10-15 DIAGNOSIS — E559 Vitamin D deficiency, unspecified: Secondary | ICD-10-CM | POA: Diagnosis not present

## 2016-10-15 DIAGNOSIS — M542 Cervicalgia: Secondary | ICD-10-CM | POA: Diagnosis not present

## 2016-10-15 DIAGNOSIS — Z79899 Other long term (current) drug therapy: Secondary | ICD-10-CM | POA: Diagnosis not present

## 2016-10-15 DIAGNOSIS — Z0000 Encounter for general adult medical examination without abnormal findings: Secondary | ICD-10-CM | POA: Diagnosis not present

## 2016-10-15 DIAGNOSIS — Z7722 Contact with and (suspected) exposure to environmental tobacco smoke (acute) (chronic): Secondary | ICD-10-CM | POA: Diagnosis not present

## 2016-10-27 DIAGNOSIS — M545 Low back pain: Secondary | ICD-10-CM | POA: Diagnosis not present

## 2016-10-27 DIAGNOSIS — M542 Cervicalgia: Secondary | ICD-10-CM | POA: Diagnosis not present

## 2016-10-27 DIAGNOSIS — M2560 Stiffness of unspecified joint, not elsewhere classified: Secondary | ICD-10-CM | POA: Diagnosis not present

## 2016-10-27 DIAGNOSIS — R262 Difficulty in walking, not elsewhere classified: Secondary | ICD-10-CM | POA: Diagnosis not present

## 2016-10-29 DIAGNOSIS — M542 Cervicalgia: Secondary | ICD-10-CM | POA: Diagnosis not present

## 2016-10-29 DIAGNOSIS — R262 Difficulty in walking, not elsewhere classified: Secondary | ICD-10-CM | POA: Diagnosis not present

## 2016-10-29 DIAGNOSIS — M545 Low back pain: Secondary | ICD-10-CM | POA: Diagnosis not present

## 2016-10-29 DIAGNOSIS — M2560 Stiffness of unspecified joint, not elsewhere classified: Secondary | ICD-10-CM | POA: Diagnosis not present

## 2016-11-03 DIAGNOSIS — M542 Cervicalgia: Secondary | ICD-10-CM | POA: Diagnosis not present

## 2016-11-03 DIAGNOSIS — M545 Low back pain: Secondary | ICD-10-CM | POA: Diagnosis not present

## 2016-11-03 DIAGNOSIS — R262 Difficulty in walking, not elsewhere classified: Secondary | ICD-10-CM | POA: Diagnosis not present

## 2016-11-03 DIAGNOSIS — M2560 Stiffness of unspecified joint, not elsewhere classified: Secondary | ICD-10-CM | POA: Diagnosis not present

## 2016-11-05 DIAGNOSIS — Z1231 Encounter for screening mammogram for malignant neoplasm of breast: Secondary | ICD-10-CM | POA: Diagnosis not present

## 2016-11-05 DIAGNOSIS — R262 Difficulty in walking, not elsewhere classified: Secondary | ICD-10-CM | POA: Diagnosis not present

## 2016-11-05 DIAGNOSIS — M542 Cervicalgia: Secondary | ICD-10-CM | POA: Diagnosis not present

## 2016-11-05 DIAGNOSIS — M2560 Stiffness of unspecified joint, not elsewhere classified: Secondary | ICD-10-CM | POA: Diagnosis not present

## 2016-11-05 DIAGNOSIS — M545 Low back pain: Secondary | ICD-10-CM | POA: Diagnosis not present

## 2016-11-09 DIAGNOSIS — M2560 Stiffness of unspecified joint, not elsewhere classified: Secondary | ICD-10-CM | POA: Diagnosis not present

## 2016-11-09 DIAGNOSIS — R262 Difficulty in walking, not elsewhere classified: Secondary | ICD-10-CM | POA: Diagnosis not present

## 2016-11-09 DIAGNOSIS — M542 Cervicalgia: Secondary | ICD-10-CM | POA: Diagnosis not present

## 2016-11-09 DIAGNOSIS — M545 Low back pain: Secondary | ICD-10-CM | POA: Diagnosis not present

## 2016-11-13 DIAGNOSIS — G4733 Obstructive sleep apnea (adult) (pediatric): Secondary | ICD-10-CM | POA: Diagnosis not present

## 2016-11-15 DIAGNOSIS — R262 Difficulty in walking, not elsewhere classified: Secondary | ICD-10-CM | POA: Diagnosis not present

## 2016-11-15 DIAGNOSIS — M545 Low back pain: Secondary | ICD-10-CM | POA: Diagnosis not present

## 2016-11-15 DIAGNOSIS — M542 Cervicalgia: Secondary | ICD-10-CM | POA: Diagnosis not present

## 2016-11-15 DIAGNOSIS — M2560 Stiffness of unspecified joint, not elsewhere classified: Secondary | ICD-10-CM | POA: Diagnosis not present

## 2016-11-16 DIAGNOSIS — M542 Cervicalgia: Secondary | ICD-10-CM | POA: Diagnosis not present

## 2016-11-16 DIAGNOSIS — M2560 Stiffness of unspecified joint, not elsewhere classified: Secondary | ICD-10-CM | POA: Diagnosis not present

## 2016-11-16 DIAGNOSIS — R262 Difficulty in walking, not elsewhere classified: Secondary | ICD-10-CM | POA: Diagnosis not present

## 2016-11-16 DIAGNOSIS — M545 Low back pain: Secondary | ICD-10-CM | POA: Diagnosis not present

## 2016-11-22 DIAGNOSIS — M545 Low back pain: Secondary | ICD-10-CM | POA: Diagnosis not present

## 2016-11-22 DIAGNOSIS — M2560 Stiffness of unspecified joint, not elsewhere classified: Secondary | ICD-10-CM | POA: Diagnosis not present

## 2016-11-22 DIAGNOSIS — M542 Cervicalgia: Secondary | ICD-10-CM | POA: Diagnosis not present

## 2016-11-22 DIAGNOSIS — R262 Difficulty in walking, not elsewhere classified: Secondary | ICD-10-CM | POA: Diagnosis not present

## 2016-11-24 DIAGNOSIS — M542 Cervicalgia: Secondary | ICD-10-CM | POA: Diagnosis not present

## 2016-11-24 DIAGNOSIS — M545 Low back pain: Secondary | ICD-10-CM | POA: Diagnosis not present

## 2016-11-24 DIAGNOSIS — R262 Difficulty in walking, not elsewhere classified: Secondary | ICD-10-CM | POA: Diagnosis not present

## 2016-11-24 DIAGNOSIS — M2560 Stiffness of unspecified joint, not elsewhere classified: Secondary | ICD-10-CM | POA: Diagnosis not present

## 2016-11-25 DIAGNOSIS — E039 Hypothyroidism, unspecified: Secondary | ICD-10-CM | POA: Diagnosis not present

## 2016-11-25 DIAGNOSIS — I10 Essential (primary) hypertension: Secondary | ICD-10-CM | POA: Diagnosis not present

## 2016-11-25 DIAGNOSIS — K219 Gastro-esophageal reflux disease without esophagitis: Secondary | ICD-10-CM | POA: Diagnosis not present

## 2016-11-25 DIAGNOSIS — E782 Mixed hyperlipidemia: Secondary | ICD-10-CM | POA: Diagnosis not present

## 2016-11-29 DIAGNOSIS — E669 Obesity, unspecified: Secondary | ICD-10-CM | POA: Diagnosis not present

## 2016-11-29 DIAGNOSIS — R262 Difficulty in walking, not elsewhere classified: Secondary | ICD-10-CM | POA: Diagnosis not present

## 2016-11-29 DIAGNOSIS — J449 Chronic obstructive pulmonary disease, unspecified: Secondary | ICD-10-CM | POA: Diagnosis not present

## 2016-11-29 DIAGNOSIS — J986 Disorders of diaphragm: Secondary | ICD-10-CM | POA: Diagnosis not present

## 2016-11-29 DIAGNOSIS — G4734 Idiopathic sleep related nonobstructive alveolar hypoventilation: Secondary | ICD-10-CM | POA: Diagnosis not present

## 2016-11-29 DIAGNOSIS — M545 Low back pain: Secondary | ICD-10-CM | POA: Diagnosis not present

## 2016-11-29 DIAGNOSIS — M2560 Stiffness of unspecified joint, not elsewhere classified: Secondary | ICD-10-CM | POA: Diagnosis not present

## 2016-11-29 DIAGNOSIS — Z789 Other specified health status: Secondary | ICD-10-CM | POA: Diagnosis not present

## 2016-11-29 DIAGNOSIS — M542 Cervicalgia: Secondary | ICD-10-CM | POA: Diagnosis not present

## 2016-11-30 DIAGNOSIS — M545 Low back pain: Secondary | ICD-10-CM | POA: Diagnosis not present

## 2016-11-30 DIAGNOSIS — R262 Difficulty in walking, not elsewhere classified: Secondary | ICD-10-CM | POA: Diagnosis not present

## 2016-11-30 DIAGNOSIS — M2560 Stiffness of unspecified joint, not elsewhere classified: Secondary | ICD-10-CM | POA: Diagnosis not present

## 2016-11-30 DIAGNOSIS — M542 Cervicalgia: Secondary | ICD-10-CM | POA: Diagnosis not present

## 2016-11-30 DIAGNOSIS — M5416 Radiculopathy, lumbar region: Secondary | ICD-10-CM | POA: Diagnosis not present

## 2016-12-07 DIAGNOSIS — M5137 Other intervertebral disc degeneration, lumbosacral region: Secondary | ICD-10-CM | POA: Diagnosis not present

## 2016-12-07 DIAGNOSIS — S161XXA Strain of muscle, fascia and tendon at neck level, initial encounter: Secondary | ICD-10-CM | POA: Diagnosis not present

## 2016-12-07 DIAGNOSIS — Z9889 Other specified postprocedural states: Secondary | ICD-10-CM | POA: Diagnosis not present

## 2016-12-07 DIAGNOSIS — M47812 Spondylosis without myelopathy or radiculopathy, cervical region: Secondary | ICD-10-CM | POA: Diagnosis not present

## 2016-12-07 DIAGNOSIS — M4727 Other spondylosis with radiculopathy, lumbosacral region: Secondary | ICD-10-CM | POA: Diagnosis not present

## 2016-12-07 DIAGNOSIS — M5030 Other cervical disc degeneration, unspecified cervical region: Secondary | ICD-10-CM | POA: Diagnosis not present

## 2016-12-13 DIAGNOSIS — G4733 Obstructive sleep apnea (adult) (pediatric): Secondary | ICD-10-CM | POA: Diagnosis not present

## 2017-01-06 DIAGNOSIS — E039 Hypothyroidism, unspecified: Secondary | ICD-10-CM | POA: Diagnosis not present

## 2017-01-12 DIAGNOSIS — M5416 Radiculopathy, lumbar region: Secondary | ICD-10-CM | POA: Diagnosis not present

## 2017-01-13 DIAGNOSIS — G4733 Obstructive sleep apnea (adult) (pediatric): Secondary | ICD-10-CM | POA: Diagnosis not present

## 2017-02-13 DIAGNOSIS — G4733 Obstructive sleep apnea (adult) (pediatric): Secondary | ICD-10-CM | POA: Diagnosis not present

## 2017-02-17 DIAGNOSIS — Z23 Encounter for immunization: Secondary | ICD-10-CM | POA: Diagnosis not present

## 2017-02-22 DIAGNOSIS — M5416 Radiculopathy, lumbar region: Secondary | ICD-10-CM | POA: Diagnosis not present

## 2017-02-22 DIAGNOSIS — M545 Low back pain: Secondary | ICD-10-CM | POA: Diagnosis not present

## 2017-03-15 DIAGNOSIS — G4733 Obstructive sleep apnea (adult) (pediatric): Secondary | ICD-10-CM | POA: Diagnosis not present

## 2017-04-15 DIAGNOSIS — G4733 Obstructive sleep apnea (adult) (pediatric): Secondary | ICD-10-CM | POA: Diagnosis not present

## 2017-05-15 DIAGNOSIS — G4733 Obstructive sleep apnea (adult) (pediatric): Secondary | ICD-10-CM | POA: Diagnosis not present

## 2017-05-17 DIAGNOSIS — M5416 Radiculopathy, lumbar region: Secondary | ICD-10-CM | POA: Diagnosis not present

## 2017-05-17 DIAGNOSIS — R05 Cough: Secondary | ICD-10-CM | POA: Diagnosis not present

## 2017-05-17 DIAGNOSIS — J069 Acute upper respiratory infection, unspecified: Secondary | ICD-10-CM | POA: Diagnosis not present

## 2017-05-17 DIAGNOSIS — M545 Low back pain: Secondary | ICD-10-CM | POA: Diagnosis not present

## 2017-05-17 DIAGNOSIS — J188 Other pneumonia, unspecified organism: Secondary | ICD-10-CM | POA: Diagnosis not present

## 2017-05-18 DIAGNOSIS — R05 Cough: Secondary | ICD-10-CM | POA: Diagnosis not present

## 2017-05-18 DIAGNOSIS — E7800 Pure hypercholesterolemia, unspecified: Secondary | ICD-10-CM | POA: Diagnosis not present

## 2017-05-18 DIAGNOSIS — Z79899 Other long term (current) drug therapy: Secondary | ICD-10-CM | POA: Diagnosis not present

## 2017-05-18 DIAGNOSIS — J449 Chronic obstructive pulmonary disease, unspecified: Secondary | ICD-10-CM | POA: Diagnosis not present

## 2017-05-18 DIAGNOSIS — J209 Acute bronchitis, unspecified: Secondary | ICD-10-CM | POA: Diagnosis not present

## 2017-05-18 DIAGNOSIS — R0602 Shortness of breath: Secondary | ICD-10-CM | POA: Diagnosis not present

## 2017-05-18 DIAGNOSIS — Z7982 Long term (current) use of aspirin: Secondary | ICD-10-CM | POA: Diagnosis not present

## 2017-05-18 DIAGNOSIS — Z9981 Dependence on supplemental oxygen: Secondary | ICD-10-CM | POA: Diagnosis not present

## 2017-05-30 DIAGNOSIS — I10 Essential (primary) hypertension: Secondary | ICD-10-CM | POA: Diagnosis not present

## 2017-05-30 DIAGNOSIS — R0609 Other forms of dyspnea: Secondary | ICD-10-CM | POA: Diagnosis not present

## 2017-05-30 DIAGNOSIS — I480 Paroxysmal atrial fibrillation: Secondary | ICD-10-CM | POA: Diagnosis not present

## 2017-05-30 DIAGNOSIS — I350 Nonrheumatic aortic (valve) stenosis: Secondary | ICD-10-CM | POA: Diagnosis not present

## 2017-05-30 DIAGNOSIS — E785 Hyperlipidemia, unspecified: Secondary | ICD-10-CM | POA: Diagnosis not present

## 2017-06-01 DIAGNOSIS — J441 Chronic obstructive pulmonary disease with (acute) exacerbation: Secondary | ICD-10-CM | POA: Diagnosis not present

## 2017-06-06 DIAGNOSIS — G4734 Idiopathic sleep related nonobstructive alveolar hypoventilation: Secondary | ICD-10-CM | POA: Diagnosis not present

## 2017-06-06 DIAGNOSIS — J441 Chronic obstructive pulmonary disease with (acute) exacerbation: Secondary | ICD-10-CM | POA: Diagnosis not present

## 2017-06-06 DIAGNOSIS — E669 Obesity, unspecified: Secondary | ICD-10-CM | POA: Diagnosis not present

## 2017-06-06 DIAGNOSIS — J986 Disorders of diaphragm: Secondary | ICD-10-CM | POA: Diagnosis not present

## 2017-06-06 DIAGNOSIS — Z789 Other specified health status: Secondary | ICD-10-CM | POA: Diagnosis not present

## 2017-06-09 DIAGNOSIS — S39012A Strain of muscle, fascia and tendon of lower back, initial encounter: Secondary | ICD-10-CM | POA: Diagnosis not present

## 2017-06-09 DIAGNOSIS — M5137 Other intervertebral disc degeneration, lumbosacral region: Secondary | ICD-10-CM | POA: Diagnosis not present

## 2017-06-09 DIAGNOSIS — R159 Full incontinence of feces: Secondary | ICD-10-CM | POA: Diagnosis not present

## 2017-06-09 DIAGNOSIS — R32 Unspecified urinary incontinence: Secondary | ICD-10-CM | POA: Diagnosis not present

## 2017-06-09 DIAGNOSIS — M4727 Other spondylosis with radiculopathy, lumbosacral region: Secondary | ICD-10-CM | POA: Diagnosis not present

## 2017-06-09 DIAGNOSIS — Z9889 Other specified postprocedural states: Secondary | ICD-10-CM | POA: Diagnosis not present

## 2017-06-15 DIAGNOSIS — G4733 Obstructive sleep apnea (adult) (pediatric): Secondary | ICD-10-CM | POA: Diagnosis not present

## 2017-06-16 DIAGNOSIS — Z6836 Body mass index (BMI) 36.0-36.9, adult: Secondary | ICD-10-CM | POA: Diagnosis not present

## 2017-06-16 DIAGNOSIS — K210 Gastro-esophageal reflux disease with esophagitis: Secondary | ICD-10-CM | POA: Diagnosis not present

## 2017-06-16 DIAGNOSIS — E038 Other specified hypothyroidism: Secondary | ICD-10-CM | POA: Diagnosis not present

## 2017-06-16 DIAGNOSIS — B370 Candidal stomatitis: Secondary | ICD-10-CM | POA: Diagnosis not present

## 2017-06-16 DIAGNOSIS — I10 Essential (primary) hypertension: Secondary | ICD-10-CM | POA: Diagnosis not present

## 2017-06-16 DIAGNOSIS — J441 Chronic obstructive pulmonary disease with (acute) exacerbation: Secondary | ICD-10-CM | POA: Diagnosis not present

## 2017-06-17 DIAGNOSIS — E038 Other specified hypothyroidism: Secondary | ICD-10-CM | POA: Diagnosis not present

## 2017-06-17 DIAGNOSIS — B370 Candidal stomatitis: Secondary | ICD-10-CM | POA: Diagnosis not present

## 2017-06-17 DIAGNOSIS — R7303 Prediabetes: Secondary | ICD-10-CM | POA: Diagnosis not present

## 2017-06-22 DIAGNOSIS — R69 Illness, unspecified: Secondary | ICD-10-CM | POA: Diagnosis not present

## 2017-06-22 DIAGNOSIS — Z114 Encounter for screening for human immunodeficiency virus [HIV]: Secondary | ICD-10-CM | POA: Diagnosis not present

## 2017-06-22 DIAGNOSIS — Z205 Contact with and (suspected) exposure to viral hepatitis: Secondary | ICD-10-CM | POA: Diagnosis not present

## 2017-06-27 DIAGNOSIS — G8929 Other chronic pain: Secondary | ICD-10-CM | POA: Diagnosis not present

## 2017-06-27 DIAGNOSIS — M4317 Spondylolisthesis, lumbosacral region: Secondary | ICD-10-CM | POA: Diagnosis not present

## 2017-06-27 DIAGNOSIS — M545 Low back pain: Secondary | ICD-10-CM | POA: Diagnosis not present

## 2017-06-27 DIAGNOSIS — M5137 Other intervertebral disc degeneration, lumbosacral region: Secondary | ICD-10-CM | POA: Diagnosis not present

## 2017-06-27 DIAGNOSIS — M5416 Radiculopathy, lumbar region: Secondary | ICD-10-CM | POA: Diagnosis not present

## 2017-06-27 DIAGNOSIS — M5136 Other intervertebral disc degeneration, lumbar region: Secondary | ICD-10-CM | POA: Diagnosis not present

## 2017-06-27 DIAGNOSIS — M5442 Lumbago with sciatica, left side: Secondary | ICD-10-CM | POA: Diagnosis not present

## 2017-07-14 DIAGNOSIS — M797 Fibromyalgia: Secondary | ICD-10-CM | POA: Diagnosis not present

## 2017-07-14 DIAGNOSIS — G4733 Obstructive sleep apnea (adult) (pediatric): Secondary | ICD-10-CM | POA: Diagnosis not present

## 2017-07-14 DIAGNOSIS — Z7982 Long term (current) use of aspirin: Secondary | ICD-10-CM | POA: Diagnosis not present

## 2017-07-14 DIAGNOSIS — Z7951 Long term (current) use of inhaled steroids: Secondary | ICD-10-CM | POA: Diagnosis not present

## 2017-07-14 DIAGNOSIS — X58XXXA Exposure to other specified factors, initial encounter: Secondary | ICD-10-CM | POA: Diagnosis not present

## 2017-07-14 DIAGNOSIS — J449 Chronic obstructive pulmonary disease, unspecified: Secondary | ICD-10-CM | POA: Diagnosis not present

## 2017-07-14 DIAGNOSIS — Z9981 Dependence on supplemental oxygen: Secondary | ICD-10-CM | POA: Diagnosis not present

## 2017-07-14 DIAGNOSIS — S73101A Unspecified sprain of right hip, initial encounter: Secondary | ICD-10-CM | POA: Diagnosis not present

## 2017-07-14 DIAGNOSIS — M25551 Pain in right hip: Secondary | ICD-10-CM | POA: Diagnosis not present

## 2017-07-14 DIAGNOSIS — Z79899 Other long term (current) drug therapy: Secondary | ICD-10-CM | POA: Diagnosis not present

## 2017-07-14 DIAGNOSIS — R69 Illness, unspecified: Secondary | ICD-10-CM | POA: Diagnosis not present

## 2017-07-14 DIAGNOSIS — S7001XA Contusion of right hip, initial encounter: Secondary | ICD-10-CM | POA: Diagnosis not present

## 2017-07-14 DIAGNOSIS — M79604 Pain in right leg: Secondary | ICD-10-CM | POA: Diagnosis not present

## 2017-08-02 DIAGNOSIS — M545 Low back pain: Secondary | ICD-10-CM | POA: Diagnosis not present

## 2017-08-02 DIAGNOSIS — M5416 Radiculopathy, lumbar region: Secondary | ICD-10-CM | POA: Diagnosis not present

## 2017-08-04 DIAGNOSIS — J441 Chronic obstructive pulmonary disease with (acute) exacerbation: Secondary | ICD-10-CM | POA: Diagnosis not present

## 2017-08-04 DIAGNOSIS — Z789 Other specified health status: Secondary | ICD-10-CM | POA: Diagnosis not present

## 2017-08-04 DIAGNOSIS — G4734 Idiopathic sleep related nonobstructive alveolar hypoventilation: Secondary | ICD-10-CM | POA: Diagnosis not present

## 2017-08-04 DIAGNOSIS — E669 Obesity, unspecified: Secondary | ICD-10-CM | POA: Diagnosis not present

## 2017-08-04 DIAGNOSIS — J986 Disorders of diaphragm: Secondary | ICD-10-CM | POA: Diagnosis not present

## 2017-08-13 DIAGNOSIS — G4733 Obstructive sleep apnea (adult) (pediatric): Secondary | ICD-10-CM | POA: Diagnosis not present

## 2017-08-15 DIAGNOSIS — J441 Chronic obstructive pulmonary disease with (acute) exacerbation: Secondary | ICD-10-CM | POA: Diagnosis not present

## 2017-08-15 DIAGNOSIS — I10 Essential (primary) hypertension: Secondary | ICD-10-CM | POA: Diagnosis not present

## 2017-08-15 DIAGNOSIS — Z6835 Body mass index (BMI) 35.0-35.9, adult: Secondary | ICD-10-CM | POA: Diagnosis not present

## 2017-08-15 DIAGNOSIS — B370 Candidal stomatitis: Secondary | ICD-10-CM | POA: Diagnosis not present

## 2017-08-15 DIAGNOSIS — M7918 Myalgia, other site: Secondary | ICD-10-CM | POA: Diagnosis not present

## 2017-08-15 DIAGNOSIS — K210 Gastro-esophageal reflux disease with esophagitis: Secondary | ICD-10-CM | POA: Diagnosis not present

## 2017-08-15 DIAGNOSIS — E038 Other specified hypothyroidism: Secondary | ICD-10-CM | POA: Diagnosis not present

## 2017-08-15 DIAGNOSIS — Z6836 Body mass index (BMI) 36.0-36.9, adult: Secondary | ICD-10-CM | POA: Diagnosis not present

## 2017-08-23 DIAGNOSIS — M5416 Radiculopathy, lumbar region: Secondary | ICD-10-CM | POA: Diagnosis not present

## 2017-08-23 DIAGNOSIS — M545 Low back pain: Secondary | ICD-10-CM | POA: Diagnosis not present

## 2017-09-05 DIAGNOSIS — M5137 Other intervertebral disc degeneration, lumbosacral region: Secondary | ICD-10-CM | POA: Diagnosis not present

## 2017-09-05 DIAGNOSIS — M5416 Radiculopathy, lumbar region: Secondary | ICD-10-CM | POA: Diagnosis not present

## 2017-09-05 DIAGNOSIS — M5442 Lumbago with sciatica, left side: Secondary | ICD-10-CM | POA: Diagnosis not present

## 2017-09-05 DIAGNOSIS — M4317 Spondylolisthesis, lumbosacral region: Secondary | ICD-10-CM | POA: Diagnosis not present

## 2017-09-05 DIAGNOSIS — M5136 Other intervertebral disc degeneration, lumbar region: Secondary | ICD-10-CM | POA: Diagnosis not present

## 2017-09-05 DIAGNOSIS — G8929 Other chronic pain: Secondary | ICD-10-CM | POA: Diagnosis not present

## 2017-09-13 DIAGNOSIS — G4733 Obstructive sleep apnea (adult) (pediatric): Secondary | ICD-10-CM | POA: Diagnosis not present

## 2017-09-14 DIAGNOSIS — M4727 Other spondylosis with radiculopathy, lumbosacral region: Secondary | ICD-10-CM | POA: Diagnosis not present

## 2017-09-14 DIAGNOSIS — M5137 Other intervertebral disc degeneration, lumbosacral region: Secondary | ICD-10-CM | POA: Diagnosis not present

## 2017-09-14 DIAGNOSIS — M48061 Spinal stenosis, lumbar region without neurogenic claudication: Secondary | ICD-10-CM | POA: Diagnosis not present

## 2017-09-14 DIAGNOSIS — M5116 Intervertebral disc disorders with radiculopathy, lumbar region: Secondary | ICD-10-CM | POA: Diagnosis not present

## 2017-09-14 DIAGNOSIS — M5416 Radiculopathy, lumbar region: Secondary | ICD-10-CM | POA: Diagnosis not present

## 2017-09-14 DIAGNOSIS — M5126 Other intervertebral disc displacement, lumbar region: Secondary | ICD-10-CM | POA: Diagnosis not present

## 2017-09-14 DIAGNOSIS — M5136 Other intervertebral disc degeneration, lumbar region: Secondary | ICD-10-CM | POA: Diagnosis not present

## 2017-09-15 DIAGNOSIS — R69 Illness, unspecified: Secondary | ICD-10-CM | POA: Diagnosis not present

## 2017-09-19 DIAGNOSIS — M5442 Lumbago with sciatica, left side: Secondary | ICD-10-CM | POA: Diagnosis not present

## 2017-09-19 DIAGNOSIS — M5416 Radiculopathy, lumbar region: Secondary | ICD-10-CM | POA: Diagnosis not present

## 2017-09-19 DIAGNOSIS — M4317 Spondylolisthesis, lumbosacral region: Secondary | ICD-10-CM | POA: Diagnosis not present

## 2017-09-19 DIAGNOSIS — M5136 Other intervertebral disc degeneration, lumbar region: Secondary | ICD-10-CM | POA: Diagnosis not present

## 2017-09-19 DIAGNOSIS — G8929 Other chronic pain: Secondary | ICD-10-CM | POA: Diagnosis not present

## 2017-09-19 DIAGNOSIS — M5137 Other intervertebral disc degeneration, lumbosacral region: Secondary | ICD-10-CM | POA: Diagnosis not present

## 2017-10-03 DIAGNOSIS — R69 Illness, unspecified: Secondary | ICD-10-CM | POA: Diagnosis not present

## 2017-10-13 DIAGNOSIS — G4733 Obstructive sleep apnea (adult) (pediatric): Secondary | ICD-10-CM | POA: Diagnosis not present

## 2017-10-24 DIAGNOSIS — M545 Low back pain: Secondary | ICD-10-CM | POA: Diagnosis not present

## 2017-10-24 DIAGNOSIS — M5416 Radiculopathy, lumbar region: Secondary | ICD-10-CM | POA: Diagnosis not present

## 2017-11-13 DIAGNOSIS — G4733 Obstructive sleep apnea (adult) (pediatric): Secondary | ICD-10-CM | POA: Diagnosis not present

## 2017-11-14 DIAGNOSIS — M797 Fibromyalgia: Secondary | ICD-10-CM | POA: Diagnosis not present

## 2017-11-14 DIAGNOSIS — K210 Gastro-esophageal reflux disease with esophagitis: Secondary | ICD-10-CM | POA: Diagnosis not present

## 2017-11-14 DIAGNOSIS — J305 Allergic rhinitis due to food: Secondary | ICD-10-CM | POA: Diagnosis not present

## 2017-11-14 DIAGNOSIS — Z6836 Body mass index (BMI) 36.0-36.9, adult: Secondary | ICD-10-CM | POA: Diagnosis not present

## 2017-11-14 DIAGNOSIS — Z0000 Encounter for general adult medical examination without abnormal findings: Secondary | ICD-10-CM | POA: Diagnosis not present

## 2017-11-14 DIAGNOSIS — J441 Chronic obstructive pulmonary disease with (acute) exacerbation: Secondary | ICD-10-CM | POA: Diagnosis not present

## 2017-11-14 DIAGNOSIS — M7918 Myalgia, other site: Secondary | ICD-10-CM | POA: Diagnosis not present

## 2017-11-16 DIAGNOSIS — M5416 Radiculopathy, lumbar region: Secondary | ICD-10-CM | POA: Diagnosis not present

## 2017-11-16 DIAGNOSIS — M545 Low back pain: Secondary | ICD-10-CM | POA: Diagnosis not present

## 2017-12-01 DIAGNOSIS — E663 Overweight: Secondary | ICD-10-CM | POA: Diagnosis not present

## 2017-12-01 DIAGNOSIS — M5136 Other intervertebral disc degeneration, lumbar region: Secondary | ICD-10-CM | POA: Diagnosis not present

## 2017-12-01 DIAGNOSIS — M5416 Radiculopathy, lumbar region: Secondary | ICD-10-CM | POA: Diagnosis not present

## 2017-12-01 DIAGNOSIS — M9983 Other biomechanical lesions of lumbar region: Secondary | ICD-10-CM | POA: Diagnosis not present

## 2017-12-01 DIAGNOSIS — M47816 Spondylosis without myelopathy or radiculopathy, lumbar region: Secondary | ICD-10-CM | POA: Diagnosis not present

## 2017-12-02 DIAGNOSIS — Z114 Encounter for screening for human immunodeficiency virus [HIV]: Secondary | ICD-10-CM | POA: Diagnosis not present

## 2017-12-02 DIAGNOSIS — R69 Illness, unspecified: Secondary | ICD-10-CM | POA: Diagnosis not present

## 2017-12-09 DIAGNOSIS — Z1231 Encounter for screening mammogram for malignant neoplasm of breast: Secondary | ICD-10-CM | POA: Diagnosis not present

## 2017-12-12 DIAGNOSIS — R69 Illness, unspecified: Secondary | ICD-10-CM | POA: Diagnosis not present

## 2017-12-13 DIAGNOSIS — G4733 Obstructive sleep apnea (adult) (pediatric): Secondary | ICD-10-CM | POA: Diagnosis not present

## 2017-12-14 ENCOUNTER — Encounter (INDEPENDENT_AMBULATORY_CARE_PROVIDER_SITE_OTHER): Payer: Self-pay | Admitting: Internal Medicine

## 2017-12-14 ENCOUNTER — Ambulatory Visit (INDEPENDENT_AMBULATORY_CARE_PROVIDER_SITE_OTHER): Payer: Medicare HMO | Admitting: Internal Medicine

## 2017-12-14 DIAGNOSIS — K219 Gastro-esophageal reflux disease without esophagitis: Secondary | ICD-10-CM | POA: Diagnosis not present

## 2017-12-14 DIAGNOSIS — I10 Essential (primary) hypertension: Secondary | ICD-10-CM

## 2017-12-14 DIAGNOSIS — E039 Hypothyroidism, unspecified: Secondary | ICD-10-CM | POA: Insufficient documentation

## 2017-12-14 DIAGNOSIS — M549 Dorsalgia, unspecified: Secondary | ICD-10-CM

## 2017-12-14 DIAGNOSIS — R1010 Upper abdominal pain, unspecified: Secondary | ICD-10-CM | POA: Diagnosis not present

## 2017-12-14 DIAGNOSIS — J441 Chronic obstructive pulmonary disease with (acute) exacerbation: Secondary | ICD-10-CM

## 2017-12-14 HISTORY — DX: Dorsalgia, unspecified: M54.9

## 2017-12-14 HISTORY — DX: Gastro-esophageal reflux disease without esophagitis: K21.9

## 2017-12-14 HISTORY — DX: Chronic obstructive pulmonary disease with (acute) exacerbation: J44.1

## 2017-12-14 HISTORY — DX: Essential (primary) hypertension: I10

## 2017-12-14 NOTE — Patient Instructions (Signed)
Labs and CT.  
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
## 2017-12-15 LAB — CBC WITH DIFFERENTIAL/PLATELET
Basophils Absolute: 83 cells/uL (ref 0–200)
Basophils Relative: 0.9 %
Eosinophils Absolute: 212 cells/uL (ref 15–500)
Eosinophils Relative: 2.3 %
HCT: 39.9 % (ref 35.0–45.0)
Hemoglobin: 13.7 g/dL (ref 11.7–15.5)
Lymphs Abs: 2944 cells/uL (ref 850–3900)
MCH: 30.3 pg (ref 27.0–33.0)
MCHC: 34.3 g/dL (ref 32.0–36.0)
MCV: 88.3 fL (ref 80.0–100.0)
MPV: 11.3 fL (ref 7.5–12.5)
Monocytes Relative: 7.2 %
Neutro Abs: 5299 cells/uL (ref 1500–7800)
Neutrophils Relative %: 57.6 %
Platelets: 235 10*3/uL (ref 140–400)
RBC: 4.52 10*6/uL (ref 3.80–5.10)
RDW: 12.4 % (ref 11.0–15.0)
Total Lymphocyte: 32 %
WBC mixed population: 662 cells/uL (ref 200–950)
WBC: 9.2 10*3/uL (ref 3.8–10.8)

## 2017-12-15 LAB — COMPREHENSIVE METABOLIC PANEL
AG Ratio: 1.9 (calc) (ref 1.0–2.5)
ALT: 12 U/L (ref 6–29)
AST: 17 U/L (ref 10–35)
Albumin: 4.4 g/dL (ref 3.6–5.1)
Alkaline phosphatase (APISO): 67 U/L (ref 33–130)
BUN: 18 mg/dL (ref 7–25)
CO2: 34 mmol/L — ABNORMAL HIGH (ref 20–32)
Calcium: 9.4 mg/dL (ref 8.6–10.4)
Chloride: 102 mmol/L (ref 98–110)
Creat: 0.85 mg/dL (ref 0.60–0.93)
Globulin: 2.3 g/dL (calc) (ref 1.9–3.7)
Glucose, Bld: 106 mg/dL (ref 65–139)
Potassium: 4.4 mmol/L (ref 3.5–5.3)
Sodium: 143 mmol/L (ref 135–146)
Total Bilirubin: 1 mg/dL (ref 0.2–1.2)
Total Protein: 6.7 g/dL (ref 6.1–8.1)

## 2017-12-16 DIAGNOSIS — Z789 Other specified health status: Secondary | ICD-10-CM | POA: Diagnosis not present

## 2017-12-16 DIAGNOSIS — J986 Disorders of diaphragm: Secondary | ICD-10-CM | POA: Diagnosis not present

## 2017-12-16 DIAGNOSIS — J441 Chronic obstructive pulmonary disease with (acute) exacerbation: Secondary | ICD-10-CM | POA: Diagnosis not present

## 2017-12-16 DIAGNOSIS — E669 Obesity, unspecified: Secondary | ICD-10-CM | POA: Diagnosis not present

## 2017-12-16 DIAGNOSIS — G4734 Idiopathic sleep related nonobstructive alveolar hypoventilation: Secondary | ICD-10-CM | POA: Diagnosis not present

## 2017-12-21 DIAGNOSIS — M5416 Radiculopathy, lumbar region: Secondary | ICD-10-CM | POA: Diagnosis not present

## 2017-12-29 ENCOUNTER — Ambulatory Visit (HOSPITAL_COMMUNITY)
Admission: RE | Admit: 2017-12-29 | Discharge: 2017-12-29 | Disposition: A | Discharge status: Home / Self Care | Payer: Medicare HMO | Source: Ambulatory Visit | Attending: Internal Medicine | Admitting: Internal Medicine

## 2017-12-29 ENCOUNTER — Encounter (HOSPITAL_COMMUNITY): Payer: Self-pay | Admitting: Radiology

## 2017-12-29 DIAGNOSIS — R1010 Upper abdominal pain, unspecified: Secondary | ICD-10-CM | POA: Insufficient documentation

## 2017-12-29 DIAGNOSIS — R1011 Right upper quadrant pain: Secondary | ICD-10-CM | POA: Diagnosis not present

## 2017-12-29 DIAGNOSIS — Q791 Other congenital malformations of diaphragm: Secondary | ICD-10-CM | POA: Insufficient documentation

## 2017-12-29 MED ORDER — IOPAMIDOL (ISOVUE-300) INJECTION 61%
100.0000 mL | Freq: Once | INTRAVENOUS | Status: AC | PRN
  Administered 2017-12-29: 100 mL via INTRAVENOUS

## 2018-01-05 DIAGNOSIS — E663 Overweight: Secondary | ICD-10-CM | POA: Diagnosis not present

## 2018-01-05 DIAGNOSIS — M9983 Other biomechanical lesions of lumbar region: Secondary | ICD-10-CM | POA: Diagnosis not present

## 2018-01-05 DIAGNOSIS — M5136 Other intervertebral disc degeneration, lumbar region: Secondary | ICD-10-CM | POA: Diagnosis not present

## 2018-01-05 DIAGNOSIS — M5416 Radiculopathy, lumbar region: Secondary | ICD-10-CM | POA: Diagnosis not present

## 2018-01-05 DIAGNOSIS — M47816 Spondylosis without myelopathy or radiculopathy, lumbar region: Secondary | ICD-10-CM | POA: Diagnosis not present

## 2018-01-13 DIAGNOSIS — G4733 Obstructive sleep apnea (adult) (pediatric): Secondary | ICD-10-CM | POA: Diagnosis not present

## 2018-01-13 DIAGNOSIS — M545 Low back pain: Secondary | ICD-10-CM | POA: Diagnosis not present

## 2018-01-18 DIAGNOSIS — M5416 Radiculopathy, lumbar region: Secondary | ICD-10-CM | POA: Diagnosis not present

## 2018-01-31 DIAGNOSIS — M5136 Other intervertebral disc degeneration, lumbar region: Secondary | ICD-10-CM | POA: Diagnosis not present

## 2018-01-31 DIAGNOSIS — M545 Low back pain: Secondary | ICD-10-CM | POA: Diagnosis not present

## 2018-01-31 DIAGNOSIS — M48061 Spinal stenosis, lumbar region without neurogenic claudication: Secondary | ICD-10-CM | POA: Diagnosis not present

## 2018-01-31 DIAGNOSIS — M5416 Radiculopathy, lumbar region: Secondary | ICD-10-CM | POA: Diagnosis not present

## 2018-01-31 DIAGNOSIS — M47816 Spondylosis without myelopathy or radiculopathy, lumbar region: Secondary | ICD-10-CM | POA: Diagnosis not present

## 2018-01-31 DIAGNOSIS — G8929 Other chronic pain: Secondary | ICD-10-CM | POA: Diagnosis not present

## 2018-01-31 DIAGNOSIS — E663 Overweight: Secondary | ICD-10-CM | POA: Diagnosis not present

## 2018-02-13 DIAGNOSIS — G4733 Obstructive sleep apnea (adult) (pediatric): Secondary | ICD-10-CM | POA: Diagnosis not present

## 2018-02-21 DIAGNOSIS — J305 Allergic rhinitis due to food: Secondary | ICD-10-CM | POA: Diagnosis not present

## 2018-02-21 DIAGNOSIS — J441 Chronic obstructive pulmonary disease with (acute) exacerbation: Secondary | ICD-10-CM | POA: Diagnosis not present

## 2018-02-21 DIAGNOSIS — K210 Gastro-esophageal reflux disease with esophagitis: Secondary | ICD-10-CM | POA: Diagnosis not present

## 2018-02-21 DIAGNOSIS — Z6836 Body mass index (BMI) 36.0-36.9, adult: Secondary | ICD-10-CM | POA: Diagnosis not present

## 2018-02-21 DIAGNOSIS — M797 Fibromyalgia: Secondary | ICD-10-CM | POA: Diagnosis not present

## 2018-02-21 DIAGNOSIS — J449 Chronic obstructive pulmonary disease, unspecified: Secondary | ICD-10-CM | POA: Diagnosis not present

## 2018-02-21 DIAGNOSIS — M7918 Myalgia, other site: Secondary | ICD-10-CM | POA: Diagnosis not present

## 2018-02-21 DIAGNOSIS — Z0000 Encounter for general adult medical examination without abnormal findings: Secondary | ICD-10-CM | POA: Diagnosis not present

## 2018-02-23 DIAGNOSIS — H353110 Nonexudative age-related macular degeneration, right eye, stage unspecified: Secondary | ICD-10-CM | POA: Diagnosis not present

## 2018-02-23 DIAGNOSIS — H524 Presbyopia: Secondary | ICD-10-CM | POA: Diagnosis not present

## 2018-03-01 DIAGNOSIS — M47816 Spondylosis without myelopathy or radiculopathy, lumbar region: Secondary | ICD-10-CM | POA: Diagnosis not present

## 2018-03-09 ENCOUNTER — Encounter (INDEPENDENT_AMBULATORY_CARE_PROVIDER_SITE_OTHER): Payer: Medicare HMO | Admitting: Ophthalmology

## 2018-03-09 DIAGNOSIS — H43813 Vitreous degeneration, bilateral: Secondary | ICD-10-CM

## 2018-03-09 DIAGNOSIS — I10 Essential (primary) hypertension: Secondary | ICD-10-CM | POA: Diagnosis not present

## 2018-03-09 DIAGNOSIS — H2513 Age-related nuclear cataract, bilateral: Secondary | ICD-10-CM

## 2018-03-09 DIAGNOSIS — H35033 Hypertensive retinopathy, bilateral: Secondary | ICD-10-CM

## 2018-03-09 DIAGNOSIS — H353132 Nonexudative age-related macular degeneration, bilateral, intermediate dry stage: Secondary | ICD-10-CM | POA: Diagnosis not present

## 2018-03-14 DIAGNOSIS — G8929 Other chronic pain: Secondary | ICD-10-CM | POA: Diagnosis not present

## 2018-03-14 DIAGNOSIS — M545 Low back pain: Secondary | ICD-10-CM | POA: Diagnosis not present

## 2018-03-14 DIAGNOSIS — M47816 Spondylosis without myelopathy or radiculopathy, lumbar region: Secondary | ICD-10-CM | POA: Diagnosis not present

## 2018-03-14 DIAGNOSIS — M5416 Radiculopathy, lumbar region: Secondary | ICD-10-CM | POA: Diagnosis not present

## 2018-03-14 DIAGNOSIS — E663 Overweight: Secondary | ICD-10-CM | POA: Diagnosis not present

## 2018-03-14 DIAGNOSIS — M5136 Other intervertebral disc degeneration, lumbar region: Secondary | ICD-10-CM | POA: Diagnosis not present

## 2018-03-14 DIAGNOSIS — M48061 Spinal stenosis, lumbar region without neurogenic claudication: Secondary | ICD-10-CM | POA: Diagnosis not present

## 2018-03-15 DIAGNOSIS — G4733 Obstructive sleep apnea (adult) (pediatric): Secondary | ICD-10-CM | POA: Diagnosis not present

## 2018-03-22 DIAGNOSIS — R69 Illness, unspecified: Secondary | ICD-10-CM | POA: Diagnosis not present

## 2018-03-27 DIAGNOSIS — R69 Illness, unspecified: Secondary | ICD-10-CM | POA: Diagnosis not present

## 2018-04-04 DIAGNOSIS — J449 Chronic obstructive pulmonary disease, unspecified: Secondary | ICD-10-CM | POA: Diagnosis not present

## 2018-04-12 DIAGNOSIS — M5136 Other intervertebral disc degeneration, lumbar region: Secondary | ICD-10-CM | POA: Diagnosis not present

## 2018-04-12 DIAGNOSIS — M48061 Spinal stenosis, lumbar region without neurogenic claudication: Secondary | ICD-10-CM | POA: Diagnosis not present

## 2018-04-15 DIAGNOSIS — G4733 Obstructive sleep apnea (adult) (pediatric): Secondary | ICD-10-CM | POA: Diagnosis not present

## 2018-04-20 DIAGNOSIS — E669 Obesity, unspecified: Secondary | ICD-10-CM | POA: Diagnosis not present

## 2018-04-20 DIAGNOSIS — J986 Disorders of diaphragm: Secondary | ICD-10-CM | POA: Diagnosis not present

## 2018-04-20 DIAGNOSIS — G4734 Idiopathic sleep related nonobstructive alveolar hypoventilation: Secondary | ICD-10-CM | POA: Diagnosis not present

## 2018-04-20 DIAGNOSIS — J441 Chronic obstructive pulmonary disease with (acute) exacerbation: Secondary | ICD-10-CM | POA: Diagnosis not present

## 2018-04-25 DIAGNOSIS — J984 Other disorders of lung: Secondary | ICD-10-CM | POA: Diagnosis not present

## 2018-04-25 DIAGNOSIS — J9811 Atelectasis: Secondary | ICD-10-CM | POA: Diagnosis not present

## 2018-04-25 DIAGNOSIS — R0600 Dyspnea, unspecified: Secondary | ICD-10-CM | POA: Diagnosis not present

## 2018-05-15 DIAGNOSIS — G4733 Obstructive sleep apnea (adult) (pediatric): Secondary | ICD-10-CM | POA: Diagnosis not present

## 2018-05-25 DIAGNOSIS — Z6838 Body mass index (BMI) 38.0-38.9, adult: Secondary | ICD-10-CM | POA: Diagnosis not present

## 2018-05-25 DIAGNOSIS — J449 Chronic obstructive pulmonary disease, unspecified: Secondary | ICD-10-CM | POA: Diagnosis not present

## 2018-05-25 DIAGNOSIS — E032 Hypothyroidism due to medicaments and other exogenous substances: Secondary | ICD-10-CM | POA: Diagnosis not present

## 2018-06-01 DIAGNOSIS — R69 Illness, unspecified: Secondary | ICD-10-CM | POA: Diagnosis not present

## 2018-06-06 DIAGNOSIS — R69 Illness, unspecified: Secondary | ICD-10-CM | POA: Diagnosis not present

## 2018-06-15 DIAGNOSIS — G4733 Obstructive sleep apnea (adult) (pediatric): Secondary | ICD-10-CM | POA: Diagnosis not present

## 2018-06-21 DIAGNOSIS — I10 Essential (primary) hypertension: Secondary | ICD-10-CM | POA: Diagnosis not present

## 2018-06-21 DIAGNOSIS — E785 Hyperlipidemia, unspecified: Secondary | ICD-10-CM | POA: Diagnosis not present

## 2018-06-21 DIAGNOSIS — I350 Nonrheumatic aortic (valve) stenosis: Secondary | ICD-10-CM | POA: Diagnosis not present

## 2018-06-21 DIAGNOSIS — R0609 Other forms of dyspnea: Secondary | ICD-10-CM | POA: Diagnosis not present

## 2018-06-21 DIAGNOSIS — R011 Cardiac murmur, unspecified: Secondary | ICD-10-CM | POA: Diagnosis not present

## 2018-06-21 DIAGNOSIS — J410 Simple chronic bronchitis: Secondary | ICD-10-CM | POA: Diagnosis not present

## 2018-06-21 DIAGNOSIS — Z9981 Dependence on supplemental oxygen: Secondary | ICD-10-CM | POA: Diagnosis not present

## 2018-06-21 DIAGNOSIS — R002 Palpitations: Secondary | ICD-10-CM | POA: Diagnosis not present

## 2018-06-21 DIAGNOSIS — R9431 Abnormal electrocardiogram [ECG] [EKG]: Secondary | ICD-10-CM | POA: Diagnosis not present

## 2018-06-26 DIAGNOSIS — Z6838 Body mass index (BMI) 38.0-38.9, adult: Secondary | ICD-10-CM | POA: Diagnosis not present

## 2018-06-26 DIAGNOSIS — M5432 Sciatica, left side: Secondary | ICD-10-CM | POA: Diagnosis not present

## 2018-06-27 DIAGNOSIS — M5136 Other intervertebral disc degeneration, lumbar region: Secondary | ICD-10-CM | POA: Diagnosis not present

## 2018-06-27 DIAGNOSIS — M25552 Pain in left hip: Secondary | ICD-10-CM | POA: Diagnosis not present

## 2018-06-29 DIAGNOSIS — M545 Low back pain: Secondary | ICD-10-CM | POA: Diagnosis not present

## 2018-06-29 DIAGNOSIS — M5416 Radiculopathy, lumbar region: Secondary | ICD-10-CM | POA: Diagnosis not present

## 2018-06-29 DIAGNOSIS — G894 Chronic pain syndrome: Secondary | ICD-10-CM | POA: Diagnosis not present

## 2018-07-10 DIAGNOSIS — I10 Essential (primary) hypertension: Secondary | ICD-10-CM | POA: Diagnosis not present

## 2018-07-10 DIAGNOSIS — I350 Nonrheumatic aortic (valve) stenosis: Secondary | ICD-10-CM | POA: Diagnosis not present

## 2018-07-15 DIAGNOSIS — G4733 Obstructive sleep apnea (adult) (pediatric): Secondary | ICD-10-CM | POA: Diagnosis not present

## 2018-08-09 DIAGNOSIS — M5416 Radiculopathy, lumbar region: Secondary | ICD-10-CM | POA: Diagnosis not present

## 2018-08-14 DIAGNOSIS — G4733 Obstructive sleep apnea (adult) (pediatric): Secondary | ICD-10-CM | POA: Diagnosis not present

## 2018-08-23 DIAGNOSIS — J441 Chronic obstructive pulmonary disease with (acute) exacerbation: Secondary | ICD-10-CM | POA: Diagnosis not present

## 2018-08-23 DIAGNOSIS — J986 Disorders of diaphragm: Secondary | ICD-10-CM | POA: Diagnosis not present

## 2018-08-23 DIAGNOSIS — G4734 Idiopathic sleep related nonobstructive alveolar hypoventilation: Secondary | ICD-10-CM | POA: Diagnosis not present

## 2018-08-23 DIAGNOSIS — E669 Obesity, unspecified: Secondary | ICD-10-CM | POA: Diagnosis not present

## 2018-08-24 DIAGNOSIS — I872 Venous insufficiency (chronic) (peripheral): Secondary | ICD-10-CM | POA: Diagnosis not present

## 2018-08-24 DIAGNOSIS — J449 Chronic obstructive pulmonary disease, unspecified: Secondary | ICD-10-CM | POA: Diagnosis not present

## 2018-08-24 DIAGNOSIS — Z6837 Body mass index (BMI) 37.0-37.9, adult: Secondary | ICD-10-CM | POA: Diagnosis not present

## 2018-08-24 DIAGNOSIS — I10 Essential (primary) hypertension: Secondary | ICD-10-CM | POA: Diagnosis not present

## 2018-09-14 DIAGNOSIS — G4733 Obstructive sleep apnea (adult) (pediatric): Secondary | ICD-10-CM | POA: Diagnosis not present

## 2018-09-14 DIAGNOSIS — R600 Localized edema: Secondary | ICD-10-CM | POA: Diagnosis not present

## 2018-09-14 DIAGNOSIS — I872 Venous insufficiency (chronic) (peripheral): Secondary | ICD-10-CM | POA: Diagnosis not present

## 2018-10-14 DIAGNOSIS — G4733 Obstructive sleep apnea (adult) (pediatric): Secondary | ICD-10-CM | POA: Diagnosis not present

## 2018-12-12 ENCOUNTER — Encounter (INDEPENDENT_AMBULATORY_CARE_PROVIDER_SITE_OTHER): Payer: Self-pay | Admitting: *Deleted

## 2020-02-20 ENCOUNTER — Encounter (INDEPENDENT_AMBULATORY_CARE_PROVIDER_SITE_OTHER): Payer: Self-pay | Admitting: Gastroenterology

## 2020-03-24 ENCOUNTER — Ambulatory Visit (INDEPENDENT_AMBULATORY_CARE_PROVIDER_SITE_OTHER): Payer: Medicare Other | Admitting: Gastroenterology

## 2020-03-24 ENCOUNTER — Other Ambulatory Visit (INDEPENDENT_AMBULATORY_CARE_PROVIDER_SITE_OTHER): Payer: Self-pay | Admitting: *Deleted

## 2020-03-24 ENCOUNTER — Encounter (INDEPENDENT_AMBULATORY_CARE_PROVIDER_SITE_OTHER): Payer: Self-pay | Admitting: *Deleted

## 2020-03-24 ENCOUNTER — Other Ambulatory Visit: Payer: Self-pay

## 2020-03-24 ENCOUNTER — Telehealth (INDEPENDENT_AMBULATORY_CARE_PROVIDER_SITE_OTHER): Payer: Self-pay | Admitting: *Deleted

## 2020-03-24 ENCOUNTER — Encounter (INDEPENDENT_AMBULATORY_CARE_PROVIDER_SITE_OTHER): Payer: Self-pay | Admitting: Gastroenterology

## 2020-03-24 VITALS — BP 100/75 | Temp 98.6°F | Ht 61.0 in | Wt 215.2 lb

## 2020-03-24 DIAGNOSIS — R1011 Right upper quadrant pain: Secondary | ICD-10-CM | POA: Diagnosis not present

## 2020-03-24 DIAGNOSIS — R112 Nausea with vomiting, unspecified: Secondary | ICD-10-CM

## 2020-03-24 DIAGNOSIS — R197 Diarrhea, unspecified: Secondary | ICD-10-CM | POA: Diagnosis not present

## 2020-03-24 MED ORDER — PLENVU 140 G PO SOLR: 1.0000 | Freq: Once | ORAL | 0 refills | Status: AC

## 2020-03-24 NOTE — Patient Instructions (Signed)
We are checking stool studies today to exclude infection and scheduling endoscopy colonoscopy.  Please call us with any questions in interim.
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
## 2020-03-24 NOTE — Telephone Encounter (Signed)
Patient needs Plenvu (copay card) ° °

## 2020-03-25 ENCOUNTER — Encounter (INDEPENDENT_AMBULATORY_CARE_PROVIDER_SITE_OTHER): Payer: Self-pay | Admitting: *Deleted

## 2020-03-28 LAB — GASTROINTESTINAL PATHOGEN PANEL PCR
C. difficile Tox A/B, PCR: NOT DETECTED
Campylobacter, PCR: NOT DETECTED
Cryptosporidium, PCR: NOT DETECTED
E coli (ETEC) LT/ST PCR: NOT DETECTED
E coli (STEC) stx1/stx2, PCR: NOT DETECTED
E coli 0157, PCR: NOT DETECTED
Giardia lamblia, PCR: NOT DETECTED
Norovirus, PCR: NOT DETECTED
Rotavirus A, PCR: NOT DETECTED
Salmonella, PCR: NOT DETECTED
Shigella, PCR: NOT DETECTED

## 2020-03-28 LAB — C. DIFFICILE GDH AND TOXIN A/B
GDH ANTIGEN: NOT DETECTED
MICRO NUMBER:: 11181620
SPECIMEN QUALITY:: ADEQUATE
TOXIN A AND B: NOT DETECTED

## 2020-03-31 ENCOUNTER — Telehealth (INDEPENDENT_AMBULATORY_CARE_PROVIDER_SITE_OTHER): Payer: Self-pay

## 2020-03-31 MED ORDER — SUPREP BOWEL PREP KIT 17.5-3.13-1.6 GM/177ML PO SOLN
1.0000 | ORAL | 0 refills | Status: AC
Start: 2020-03-31 — End: ?

## 2020-04-09 NOTE — Patient Instructions (Signed)
Your procedure is scheduled on: 04/16/2020  Report to [facility] at  9:30   AM.  Call this number if you have problems the morning of surgery: [phone]   Remember:              Follow Directions on the letter you received from Your Physician's office regarding the Bowel Prep              No Smoking the day of Procedure :   Take these medicines the morning of surgery with A SIP OF WATER: xanax, cymbalta, gabapentin, levothyroxine, metoprolol, protonix and tramadol if needed   Use inhalers if needed   Do not wear jewelry, make-up or nail polish.    Do not bring valuables to the hospital.  Contacts, dentures or bridgework may not be worn into surgery.  .   Patients discharged the day of surgery will not be allowed to drive home.     Colonoscopy, Adult, Care After This sheet gives you information about how to care for yourself after your procedure. Your health care provider may also give you more specific instructions. If you have problems or questions, contact your health care provider. What can I expect after the procedure? After the procedure, it is common to have:  A small amount of blood in your stool for 24 hours after the procedure.  Some gas.  Mild abdominal cramping or bloating.  Follow these instructions at home: General instructions   For the first 24 hours after the procedure: ? Do not drive or use machinery. ? Do not sign important documents. ? Do not drink alcohol. ? Do your regular daily activities at a slower pace than normal. ? Eat soft, easy-to-digest foods. ? Rest often.  Take over-the-counter or prescription medicines only as told by your health care provider.  It is up to you to get the results of your procedure. Ask your health care provider, or the department performing the procedure, when your results will be ready. Relieving cramping and bloating  Try walking around when you have cramps or feel bloated.  Apply heat to your abdomen as  told by your health care provider. Use a heat source that your health care provider recommends, such as a moist heat pack or a heating pad. ? Place a towel between your skin and the heat source. ? Leave the heat on for 20-30 minutes. ? Remove the heat if your skin turns bright red. This is especially important if you are unable to feel pain, heat, or cold. You may have a greater risk of getting burned. Eating and drinking  Drink enough fluid to keep your urine clear or pale yellow.  Resume your normal diet as instructed by your health care provider. Avoid heavy or fried foods that are hard to digest.  Avoid drinking alcohol for as long as instructed by your health care provider. Contact a health care provider if:  You have blood in your stool 2-3 days after the procedure. Get help right away if:  You have more than a small spotting of blood in your stool.  You pass large blood clots in your stool.  Your abdomen is swollen.  You have nausea or vomiting.  You have a fever.  You have increasing abdominal pain that is not relieved with medicine. This information is not intended to replace advice given to you by your health care provider. Make sure you discuss any questions you have with your health care provider. Document Released: 12/16/2003  Document Revised: 01/26/2016 Document Reviewed: 07/15/2015 Elsevier Interactive Patient Education  2018 [place].  Upper Endoscopy, Adult, Care After This sheet gives you information about how to care for yourself after your procedure. Your health care provider may also give you more specific instructions. If you have problems or questions, contact your health care provider. What can I expect after the procedure? After the procedure, it is common to have:  A sore throat.  Mild stomach pain or discomfort.  Bloating.  Nausea. Follow these instructions at home:   Follow instructions from your health care provider about what to eat or  drink after your procedure.  Return to your normal activities as told by your health care provider. Ask your health care provider what activities are safe for you.  Take over-the-counter and prescription medicines only as told by your health care provider.  Do not drive for 24 hours if you were given a sedative during your procedure.  Keep all follow-up visits as told by your health care provider. This is important. Contact a health care provider if you have:  A sore throat that lasts longer than one day.  Trouble swallowing. Get help right away if:  You vomit blood or your vomit looks like coffee grounds.  You have: ? A fever. ? Bloody, black, or tarry stools. ? A severe sore throat or you cannot swallow. ? Difficulty breathing. ? Severe pain in your chest or abdomen. Summary  After the procedure, it is common to have a sore throat, mild stomach discomfort, bloating, and nausea.  Do not drive for 24 hours if you were given a sedative during the procedure.  Follow instructions from your health care provider about what to eat or drink after your procedure.  Return to your normal activities as told by your health care provider. This information is not intended to replace advice given to you by your health care provider. Make sure you discuss any questions you have with your health care provider. Document Revised: 10/25/2017 Document Reviewed: 10/03/2017 Elsevier Patient Education  [place].

## 2020-04-14 ENCOUNTER — Encounter (HOSPITAL_COMMUNITY): Payer: Self-pay

## 2020-04-14 ENCOUNTER — Other Ambulatory Visit: Payer: Self-pay

## 2020-04-14 ENCOUNTER — Other Ambulatory Visit (HOSPITAL_COMMUNITY)
Admission: RE | Admit: 2020-04-14 | Discharge: 2020-04-14 | Disposition: A | Discharge status: Home / Self Care | Payer: Medicare Other | Source: Ambulatory Visit | Attending: Internal Medicine | Admitting: Internal Medicine

## 2020-04-14 ENCOUNTER — Encounter (HOSPITAL_COMMUNITY)
Admission: RE | Admit: 2020-04-14 | Discharge: 2020-04-14 | Disposition: A | Discharge status: Home / Self Care | Payer: Medicare Other | Source: Ambulatory Visit | Attending: Internal Medicine | Admitting: Internal Medicine

## 2020-04-14 DIAGNOSIS — I10 Essential (primary) hypertension: Secondary | ICD-10-CM | POA: Diagnosis not present

## 2020-04-14 DIAGNOSIS — Z01818 Encounter for other preprocedural examination: Secondary | ICD-10-CM | POA: Insufficient documentation

## 2020-04-14 DIAGNOSIS — Z20822 Contact with and (suspected) exposure to covid-19: Secondary | ICD-10-CM | POA: Insufficient documentation

## 2020-04-14 LAB — BASIC METABOLIC PANEL
Anion gap: 9 (ref 5–15)
BUN: 14 mg/dL (ref 8–23)
CO2: 29 mmol/L (ref 22–32)
Calcium: 9 mg/dL (ref 8.9–10.3)
Chloride: 104 mmol/L (ref 98–111)
Creatinine, Ser: 0.89 mg/dL (ref 0.44–1.00)
GFR, Estimated: >60 mL/min (ref >60)
Glucose, Bld: 109 mg/dL — ABNORMAL HIGH (ref 70–99)
Potassium: 3.7 mmol/L (ref 3.5–5.1)
Sodium: 142 mmol/L (ref 135–145)

## 2020-04-15 LAB — SARS CORONAVIRUS 2 (TAT 6-24 HRS): SARS Coronavirus 2: NEGATIVE

## 2020-04-16 ENCOUNTER — Ambulatory Visit (HOSPITAL_COMMUNITY)
Admission: RE | Admit: 2020-04-16 | Discharge: 2020-04-16 | Disposition: A | Discharge status: Home / Self Care | Payer: Medicare Other | Attending: Internal Medicine | Admitting: Internal Medicine

## 2020-04-16 ENCOUNTER — Ambulatory Visit (HOSPITAL_COMMUNITY): Payer: Medicare Other | Admitting: Certified Registered"

## 2020-04-16 ENCOUNTER — Encounter (HOSPITAL_COMMUNITY)
Admission: RE | Disposition: A | Discharge status: Home / Self Care | Payer: Self-pay | Source: Home / Self Care | Attending: Internal Medicine

## 2020-04-16 ENCOUNTER — Encounter (HOSPITAL_COMMUNITY): Payer: Self-pay | Admitting: Internal Medicine

## 2020-04-16 DIAGNOSIS — Z79890 Hormone replacement therapy (postmenopausal): Secondary | ICD-10-CM | POA: Diagnosis not present

## 2020-04-16 DIAGNOSIS — K219 Gastro-esophageal reflux disease without esophagitis: Secondary | ICD-10-CM | POA: Insufficient documentation

## 2020-04-16 DIAGNOSIS — K766 Portal hypertension: Secondary | ICD-10-CM | POA: Insufficient documentation

## 2020-04-16 DIAGNOSIS — K649 Unspecified hemorrhoids: Secondary | ICD-10-CM

## 2020-04-16 DIAGNOSIS — Z7951 Long term (current) use of inhaled steroids: Secondary | ICD-10-CM | POA: Insufficient documentation

## 2020-04-16 DIAGNOSIS — K3189 Other diseases of stomach and duodenum: Secondary | ICD-10-CM

## 2020-04-16 DIAGNOSIS — K2289 Other specified disease of esophagus: Secondary | ICD-10-CM

## 2020-04-16 DIAGNOSIS — Z79899 Other long term (current) drug therapy: Secondary | ICD-10-CM | POA: Insufficient documentation

## 2020-04-16 DIAGNOSIS — K648 Other hemorrhoids: Secondary | ICD-10-CM | POA: Diagnosis not present

## 2020-04-16 DIAGNOSIS — K31A11 Gastric intestinal metaplasia without dysplasia, involving the antrum: Secondary | ICD-10-CM | POA: Insufficient documentation

## 2020-04-16 DIAGNOSIS — Z1211 Encounter for screening for malignant neoplasm of colon: Secondary | ICD-10-CM | POA: Diagnosis not present

## 2020-04-16 DIAGNOSIS — Z886 Allergy status to analgesic agent status: Secondary | ICD-10-CM | POA: Insufficient documentation

## 2020-04-16 DIAGNOSIS — K644 Residual hemorrhoidal skin tags: Secondary | ICD-10-CM | POA: Insufficient documentation

## 2020-04-16 DIAGNOSIS — Z7982 Long term (current) use of aspirin: Secondary | ICD-10-CM | POA: Insufficient documentation

## 2020-04-16 DIAGNOSIS — K6389 Other specified diseases of intestine: Secondary | ICD-10-CM

## 2020-04-16 DIAGNOSIS — Z09 Encounter for follow-up examination after completed treatment for conditions other than malignant neoplasm: Secondary | ICD-10-CM

## 2020-04-16 DIAGNOSIS — K2970 Gastritis, unspecified, without bleeding: Secondary | ICD-10-CM

## 2020-04-16 DIAGNOSIS — Z86010 Personal history of colonic polyps: Secondary | ICD-10-CM

## 2020-04-16 DIAGNOSIS — D125 Benign neoplasm of sigmoid colon: Secondary | ICD-10-CM | POA: Insufficient documentation

## 2020-04-16 DIAGNOSIS — R112 Nausea with vomiting, unspecified: Secondary | ICD-10-CM

## 2020-04-16 DIAGNOSIS — R1011 Right upper quadrant pain: Secondary | ICD-10-CM

## 2020-04-16 HISTORY — PX: COLONOSCOPY WITH PROPOFOL: SHX5780

## 2020-04-16 HISTORY — PX: ESOPHAGOGASTRODUODENOSCOPY (EGD) WITH PROPOFOL: SHX5813

## 2020-04-16 HISTORY — PX: BIOPSY: SHX5522

## 2020-04-16 HISTORY — PX: POLYPECTOMY: SHX5525

## 2020-04-16 SURGERY — COLONOSCOPY WITH PROPOFOL
Anesthesia: General

## 2020-04-16 MED ORDER — LACTATED RINGERS IV SOLN: INTRAVENOUS | Status: DC | PRN

## 2020-04-16 MED ORDER — PHENYLEPHRINE 40 MCG/ML (10ML) SYRINGE FOR IV PUSH (FOR BLOOD PRESSURE SUPPORT)
PREFILLED_SYRINGE | INTRAVENOUS | Status: DC | PRN
  Administered 2020-04-16 (×2): 80 ug via INTRAVENOUS

## 2020-04-16 MED ORDER — PROPOFOL 500 MG/50ML IV EMUL
INTRAVENOUS | Status: DC | PRN
  Administered 2020-04-16: 150 ug/kg/min via INTRAVENOUS

## 2020-04-16 MED ORDER — PROPOFOL 10 MG/ML IV BOLUS
INTRAVENOUS | Status: DC | PRN
  Administered 2020-04-16: 20 mg via INTRAVENOUS
  Administered 2020-04-16: 80 mg via INTRAVENOUS
  Administered 2020-04-16: 40 mg via INTRAVENOUS

## 2020-04-16 MED ORDER — STERILE WATER FOR IRRIGATION IR SOLN
Status: DC | PRN
  Administered 2020-04-16: 1.5 mL

## 2020-04-16 MED ORDER — LIDOCAINE VISCOUS HCL 2 % MT SOLN
15.0000 mL | Freq: Once | OROMUCOSAL | Status: AC
  Administered 2020-04-16: 15 mL via OROMUCOSAL

## 2020-04-16 MED ORDER — LIDOCAINE VISCOUS HCL 2 % MT SOLN
OROMUCOSAL | Status: AC
  Filled 2020-04-16: qty 15

## 2020-04-16 MED ORDER — GLYCOPYRROLATE 0.2 MG/ML IJ SOLN
0.2000 mg | Freq: Once | INTRAMUSCULAR | Status: AC
  Administered 2020-04-16: 0.2 mg via INTRAVENOUS

## 2020-04-16 MED ORDER — LACTATED RINGERS IV SOLN: INTRAVENOUS | Status: DC

## 2020-04-16 MED ORDER — GLYCOPYRROLATE 0.2 MG/ML IJ SOLN
INTRAMUSCULAR | Status: AC
Start: 2020-04-16 — End: ?
  Filled 2020-04-16: qty 1

## 2020-04-16 MED ORDER — LIDOCAINE HCL (CARDIAC) PF 100 MG/5ML IV SOSY
PREFILLED_SYRINGE | INTRAVENOUS | Status: DC | PRN
  Administered 2020-04-16: 50 mg via INTRAVENOUS

## 2020-04-16 NOTE — Op Note (Signed)
[facility] Patient Name: Joyce Gomez Procedure Date: 04/16/2020 11:28 AM MRN: [id number] Date of Birth: September 06, 1945 Attending MD: Hildred Laser , MD CSN: [id number] Age: 74 Admit Type: Outpatient Procedure:                Upper GI endoscopy Indications:              Abdominal pain in the right upper quadrant, Nausea                            with vomiting Providers:                Hildred Laser, MD, Otis Peak B. Sharon Seller, RN, Dereck Leep, Technician, Aram Candela Referring MD:             Stoney Bang, MD Medicines:                Propofol per Anesthesia Complications:            No immediate complications. Estimated Blood Loss:     Estimated blood loss was minimal. Procedure:                Pre-Anesthesia Assessment:                           - Prior to the procedure, a History and Physical                            was performed, and patient medications and                            allergies were reviewed. The patient's tolerance of                            previous anesthesia was also reviewed. The risks                            and benefits of the procedure and the sedation                            options and risks were discussed with the patient.                            All questions were answered, and informed consent                            was obtained. Prior Anticoagulants: The patient has                            taken no previous anticoagulant or antiplatelet                            agents except for aspirin. ASA Grade Assessment: II                            -  A patient with mild systemic disease. After                            reviewing the risks and benefits, the patient was                            deemed in satisfactory condition to undergo the                            procedure.                           After obtaining informed consent, the endoscope was                            passed  under direct vision. Throughout the                            procedure, the patient's blood pressure, pulse, and                            oxygen saturations were monitored continuously. The                            GIF-H190 ([id number]) scope was introduced through the                            mouth, and advanced to the second part of duodenum.                            The upper GI endoscopy was accomplished without                            difficulty. The patient tolerated the procedure                            well. Scope In: 11:51:48 AM Scope Out: 12:04:59 PM Total Procedure Duration: 0 hours 13 minutes 11 seconds  Findings:      The hypopharynx was normal.      The examined esophagus was normal.      The Z-line was irregular and was found 41 cm from the incisors. Biopsies       were taken with a cold forceps for histology. The pathology specimen was       placed into Bottle Number 3.      Patchy mild inflammation characterized by congestion (edema), erythema       and granularity was found in the gastric antrum. Biopsies were taken       with a cold forceps for histology. The pathology specimen was placed       into Bottle Number 2.      Mild portal hypertensive gastropathy was found in the gastric fundus and       in the gastric body.      The duodenal bulb and second portion of the duodenum were normal.       Biopsies for histology were taken with a  cold forceps for evaluation of       celiac disease. The pathology specimen was placed into Bottle Number 1. Impression:               - Normal hypopharynx.                           - Normal esophagus.                           - Z-line irregular, 41 cm from the incisors.                            Biopsied.                           - Gastritis. Biopsied.                           - Portal hypertensive gastropathy.                           - Normal duodenal bulb and second portion of the                             duodenum. Biopsied. Moderate Sedation:      Per Anesthesia Care Recommendation:           - Patient has a contact number available for                            emergencies. The signs and symptoms of potential                            delayed complications were discussed with the                            patient. Return to normal activities tomorrow.                            Written discharge instructions were provided to the                            patient.                           - Resume previous diet today.                           - Continue present medications.                           - No aspirin, ibuprofen, naproxen, or other                            non-steroidal anti-inflammatory drugs for 1 day.                           -  Await pathology results. Procedure Code(s):        --- Professional ---                           [phone], Esophagogastroduodenoscopy, flexible,                            transoral; with biopsy, single or multiple Diagnosis Code(s):        --- Professional ---                           K22.8, Other specified diseases of esophagus                           K29.70, Gastritis, unspecified, without bleeding                           K76.6, Portal hypertension                           K31.89, Other diseases of stomach and duodenum                           R10.11, Right upper quadrant pain                           R11.2, Nausea with vomiting, unspecified CPT copyright 2019 American Medical Association. All rights reserved. The codes documented in this report are preliminary and upon coder review may  be revised to meet current compliance requirements. Hildred Laser, MD Hildred Laser, MD 04/16/2020 12:43:47 PM This report has been signed electronically. Number of Addenda: 0

## 2020-04-16 NOTE — H&P (Signed)
Joyce Gomez is an 74 y.o. female.   Chief Complaint: Patient is here for esophagogastroduodenoscopy and colonoscopy. HPI: 74 year old Caucasian female who presents with 19-monthhistory of postprandial nausea vomiting and right upper quadrant abdominal pain.  Vomiting usually occurs at night when she lies supine.  She denies hematemesis or melena.  She also complains of diarrhea.  On some days she has as many as 6-8 stools per day.  She has not lost any weight despite her symptoms.  Stool studies were negative.  She also has history of colonic polyps and due for surveillance colonoscopy. Family history is negative for CRC or inflammatory bowel disease.  Past Medical History:  Diagnosis Date   Back pain 12/14/2017   COPD exacerbation ([facility] 12/14/2017   Fibromyalgia    GERD (gastroesophageal reflux disease) 12/14/2017   Hypertension 12/14/2017   Hypothyroid     Past Surgical History:  Procedure Laterality Date   bilateral bunionectomy     CHOLECYSTECTOMY     complete hysterectomy     hiatal hernia surgery     NECK SURGERY     THYROID SURGERY      History reviewed. No pertinent family history. Social History:  reports that she has never smoked. She has never used smokeless tobacco. She reports that she does not drink alcohol and does not use drugs.  Allergies:  Allergies  Allergen Reactions   Aspirin Nausea And Vomiting    Medications Prior to Admission  Medication Sig Dispense Refill   albuterol (PROVENTIL HFA;VENTOLIN HFA) 108 (90 Base) MCG/ACT inhaler Inhale 1-2 puffs into the lungs every 6 (six) hours as needed for wheezing or shortness of breath.      ALPRAZolam (XANAX) 0.5 MG tablet Take 0.5 mg by mouth 3 (three) times daily.      aspirin EC 81 MG tablet Take 81 mg by mouth daily.     benazepril-hydrochlorthiazide (LOTENSIN HCT) 20-25 MG tablet Take 1 tablet by mouth daily.     calcium-vitamin D (OSCAL WITH D) 500-200 MG-UNIT tablet Take 1 tablet by mouth  daily with breakfast.      Cholecalciferol (VITAMIN D3) 10000 units TABS Take 10,000 Units by mouth daily.      DULoxetine (CYMBALTA) 60 MG capsule Take 60 mg by mouth daily.     fluticasone furoate-vilanterol (BREO ELLIPTA) 100-25 MCG/INH AEPB Inhale 1 puff into the lungs daily.     gabapentin (NEURONTIN) 800 MG tablet Take 800 mg by mouth 3 (three) times daily.      glucosamine-chondroitin 500-400 MG tablet Take 1 tablet by mouth 3 (three) times daily.     ipratropium-albuterol (DUONEB) 0.5-2.5 (3) MG/3ML SOLN Take 3 mLs by nebulization every 6 (six) hours as needed (shortness of breath or wheezing).      levothyroxine (SYNTHROID, LEVOTHROID) 112 MCG tablet Take 112 mcg by mouth daily before breakfast.     lovastatin (MEVACOR) 40 MG tablet Take 40 mg by mouth at bedtime.     metoprolol tartrate (LOPRESSOR) 25 MG tablet Take 25 mg by mouth 2 (two) times daily.     Multiple Vitamin (MULTIVITAMIN) tablet Take 1 tablet by mouth daily.     Na Sulfate-K Sulfate-Mg Sulf (SUPREP BOWEL PREP KIT) 17.5-3.13-1.6 GM/177ML SOLN Take 1 kit by mouth as directed. 177 mL 0   Omega-3 Fatty Acids (FISH OIL) 1000 MG CAPS Take 1,000 mg by mouth in the morning, at noon, and at bedtime.     pantoprazole (PROTONIX) 40 MG tablet Take 40 mg by mouth 2 (  two) times daily.      tiZANidine (ZANAFLEX) 4 MG tablet Take 4 mg by mouth 3 (three) times daily.      traMADol (ULTRAM) 50 MG tablet Take 50 mg by mouth 3 (three) times daily as needed for moderate pain.      vitamin B-12 (CYANOCOBALAMIN) 500 MCG tablet Take 500 mcg by mouth daily.     lisinopril-hydrochlorothiazide (PRINZIDE,ZESTORETIC) 20-25 MG tablet Take 1 tablet by mouth daily. (Patient not taking: Reported on 03/24/2020)     nortriptyline (PAMELOR) 10 MG capsule Take 10 mg by mouth at bedtime. (Patient not taking: Reported on 03/24/2020)     sucralfate (CARAFATE) 1 g tablet Take 1 g by mouth 4 (four) times daily. (Patient not taking: Reported on  04/07/2020)      Results for orders placed or performed during the hospital encounter of 04/14/20 (from the past 48 hour(s))  SARS CORONAVIRUS 2 (TAT 6-24 HRS) Nasopharyngeal Nasopharyngeal Swab     Status: None   Collection Time: 04/14/20  3:08 PM   Specimen: Nasopharyngeal Swab  Result Value Ref Range   SARS Coronavirus 2 NEGATIVE NEGATIVE    Comment: (NOTE) SARS-CoV-2 target nucleic acids are NOT DETECTED.  The SARS-CoV-2 RNA is generally detectable in upper and lower respiratory specimens during the acute phase of infection. Negative results do not preclude SARS-CoV-2 infection, do not rule out co-infections with other pathogens, and should not be used as the sole basis for treatment or other patient management decisions. Negative results must be combined with clinical observations, patient history, and epidemiological information. The expected result is Negative.  Fact Sheet for Patients: SugarRoll.be  Fact Sheet for Healthcare Providers: https://www.woods-mathews.com/  This test is not yet approved or cleared by the [place] FDA and  has been authorized for detection and/or diagnosis of SARS-CoV-2 by FDA under an Emergency Use Authorization (EUA). This EUA will remain  in effect (meaning this test can be used) for the duration of the COVID-19 declaration under Se ction 564(b)(1) of the Act, 21 U.S.C. section 360bbb-3(b)(1), unless the authorization is terminated or revoked sooner.  Performed at [facility], [place] [address] [place], [place] [address]   Basic metabolic panel     Status: Abnormal   Collection Time: 04/14/20  3:32 PM  Result Value Ref Range   Sodium 142 135 - 145 mmol/L   Potassium 3.7 3.5 - 5.1 mmol/L   Chloride 104 98 - 111 mmol/L   CO2 29 22 - 32 mmol/L   Glucose, Bld 109 (H) 70 - 99 mg/dL    Comment: Glucose reference range applies only to samples taken after fasting for at least 8 hours.   BUN  14 8 - 23 mg/dL   Creatinine, Ser 0.89 0.44 - 1.00 mg/dL   Calcium 9.0 8.9 - 10.3 mg/dL   GFR, Estimated >60 >60 mL/min    Comment: (NOTE) Calculated using the CKD-EPI Creatinine Equation (2021)    Anion gap 9 5 - 15    Comment: Performed at [facility], [address], [place], [place] [address]   No results found.  Review of Systems  Blood pressure (!) 114/59, pulse 73, temperature 98 F (36.7 C), temperature source Oral, resp. rate 18, SpO2 93 %. Physical Exam HENT:     Mouth/Throat:     Mouth: Mucous membranes are moist.     Pharynx: Oropharynx is clear.  Eyes:     General: No scleral icterus.    Conjunctiva/sclera: Conjunctivae normal.  Neck:  Comments: She has 2 scars over her lower neck anteriorly. Cardiovascular:     Rate and Rhythm: Normal rate.     Heart sounds: Normal heart sounds. No murmur heard.   Pulmonary:     Effort: Pulmonary effort is normal.     Breath sounds: Normal breath sounds.  Abdominal:     Comments: Abdomen is full.  She has upper midline scar.  On palpation abdomen soft.  She has mild tenderness in right upper quadrant.  No organomegaly or masses.  Musculoskeletal:        General: No swelling.     Cervical back: Neck supple.  Skin:    General: Skin is warm and dry.  Neurological:     Mental Status: She is alert.      Assessment/Plan  Nausea vomiting and right upper quadrant abdominal pain. Diarrhea. History of colonic polyps. Diagnostic esophagogastroduodenoscopy and surveillance colonoscopy.  Hildred Laser, MD 04/16/2020, 11:46 AM

## 2020-04-16 NOTE — Transfer of Care (Signed)
Immediate Anesthesia Transfer of Care Note  Patient: Joyce Gomez  Procedure(s) Performed: COLONOSCOPY WITH PROPOFOL (N/A ) ESOPHAGOGASTRODUODENOSCOPY (EGD) WITH PROPOFOL (N/A ) BIOPSY POLYPECTOMY  Patient Location: PACU  Anesthesia Type:General  Level of Consciousness: awake, alert  and oriented  Airway & Oxygen Therapy: Patient Spontanous Breathing  Post-op Assessment: Report given to RN, Post -op Vital signs reviewed and stable and Patient moving all extremities X 4  Post vital signs: Reviewed and stable  Last Vitals:  Vitals Value Taken Time  BP 86/46 04/16/20 1239  Temp 36.4 C 04/16/20 1240  Pulse    Resp 21 04/16/20 1241  SpO2    Vitals shown include unvalidated device data.  Last Pain:  Vitals:   04/16/20 1151  TempSrc:   PainSc: 0-No pain      Patients Stated Pain Goal: 5 (98/61/48 3073)  Complications: No complications documented.

## 2020-04-16 NOTE — Anesthesia Procedure Notes (Signed)
Date/Time: 04/16/2020 11:59 AM Performed by: Orlie Dakin, CRNA Pre-anesthesia Checklist: Patient identified, Emergency Drugs available, Suction available and Patient being monitored Patient Re-evaluated:Patient Re-evaluated prior to induction Oxygen Delivery Method: Nasal cannula Induction Type: IV induction Placement Confirmation: positive ETCO2

## 2020-04-16 NOTE — Anesthesia Preprocedure Evaluation (Signed)
Anesthesia Evaluation  Patient identified by MRN, date of birth, ID band Patient awake    Reviewed: Allergy & Precautions, H&P , NPO status , Patient's Chart, lab work & pertinent test results, reviewed documented beta blocker date and time   Airway Mallampati: II  TM Distance: >3 FB Neck ROM: full    Dental no notable dental hx.    Pulmonary neg pulmonary ROS, COPD,    Pulmonary exam normal breath sounds clear to auscultation       Cardiovascular Exercise Tolerance: Good hypertension, negative cardio ROS   Rhythm:regular Rate:Normal     Neuro/Psych  Neuromuscular disease negative psych ROS   GI/Hepatic Neg liver ROS, GERD  Medicated,  Endo/Other  Hypothyroidism   Renal/GU negative Renal ROS  negative genitourinary   Musculoskeletal   Abdominal   Peds  Hematology negative hematology ROS (+)   Anesthesia Other Findings   Reproductive/Obstetrics negative OB ROS                             Anesthesia Physical Anesthesia Plan  ASA: II  Anesthesia Plan: General   Post-op Pain Management:    Induction:   PONV Risk Score and Plan: Propofol infusion  Airway Management Planned:   Additional Equipment:   Intra-op Plan:   Post-operative Plan:   Informed Consent: I have reviewed the patients History and Physical, chart, labs and discussed the procedure including the risks, benefits and alternatives for the proposed anesthesia with the patient or authorized representative who has indicated his/her understanding and acceptance.     Dental Advisory Given  Plan Discussed with: CRNA  Anesthesia Plan Comments:         Anesthesia Quick Evaluation

## 2020-04-16 NOTE — Op Note (Signed)
[facility] Patient Name: Joyce Gomez Procedure Date: 04/16/2020 12:07 PM MRN: [id number] Date of Birth: [birth year] Attending MD: Hildred Laser , MD CSN: [id number] Age: 74 Admit Type: Outpatient Procedure:                Colonoscopy Indications:              High risk colon cancer surveillance: Personal                            history of colonic polyps Providers:                Hildred Laser, MD, Caprice Kluver, Casimer Bilis, Technician Referring MD:             Stoney Bang, MD Medicines:                Propofol per Anesthesia Complications:            No immediate complications. Estimated Blood Loss:     Estimated blood loss was minimal. Procedure:                Pre-Anesthesia Assessment:                           - Prior to the procedure, a History and Physical                            was performed, and patient medications and                            allergies were reviewed. The patient's tolerance of                            previous anesthesia was also reviewed. The risks                            and benefits of the procedure and the sedation                            options and risks were discussed with the patient.                            All questions were answered, and informed consent                            was obtained. Prior Anticoagulants: The patient has                            taken no previous anticoagulant or antiplatelet                            agents except for aspirin. ASA Grade Assessment: II                            -  A patient with mild systemic disease. After                            reviewing the risks and benefits, the patient was                            deemed in satisfactory condition to undergo the                            procedure.                           After obtaining informed consent, the colonoscope                            was passed under direct vision.  Throughout the                            procedure, the patient's blood pressure, pulse, and                            oxygen saturations were monitored continuously. The                            PCF-[id number] ([id number]) was introduced through the                            anus and advanced to the the cecum, identified by                            appendiceal orifice and ileocecal valve. The                            colonoscopy was somewhat difficult due to a                            tortuous colon. Successful completion of the                            procedure was aided by using manual pressure and                            scope guide. The patient tolerated the procedure                            well. The quality of the bowel preparation was                            adequate to identify polyps. The ileocecal valve,                            appendiceal orifice, and rectum were photographed. Scope In: 12:08:31 PM Scope Out: 12:34:36 PM Scope Withdrawal Time: 0 hours 15 minutes 9 seconds  Total Procedure Duration: 0  hours 26 minutes 5 seconds  Findings:      Skin tags were found on perianal exam.      A 4 mm polyp was found in the mid sigmoid colon. The polyp was flat. The       polyp was removed with a cold snare. Resection and retrieval were       complete. The pathology specimen was placed into Bottle Number 5.      The exam was otherwise normal throughout the examined colon.      Biopsies for histology were taken with a cold forceps from the ascending       colon and right colon for evaluation of microscopic colitis. The       pathology specimen was placed into Bottle Number 4.      Internal and external hemorrhoids were found during retroflexion. The       hemorrhoids were small. Impression:               - Perianal skin tags found on perianal exam.                           - One 4 mm polyp in the mid sigmoid colon, removed                            with a cold  snare. Resected and retrieved.                           -Internal and external hemorrhoids.                           - Biopsies were taken with a cold forceps from the                            ascending colon and right colon for evaluation of                            microscopic colitis. Moderate Sedation:      Per Anesthesia Care Recommendation:           - Patient has a contact number available for                            emergencies. The signs and symptoms of potential                            delayed complications were discussed with the                            patient. Return to normal activities tomorrow.                            Written discharge instructions were provided to the                            patient.                           -  Resume previous diet today.                           - Continue present medications.                           - No aspirin, ibuprofen, naproxen, or other                            non-steroidal anti-inflammatory drugs for 1 day.                           - Await pathology results.                           - Repeat colonoscopy is recommended. The                            colonoscopy date will be determined after pathology                            results from today's exam become available for                            review. Procedure Code(s):        --- Professional ---                           [phone], Colonoscopy, flexible; with removal of                            tumor(s), polyp(s), or other lesion(s) by snare                            technique                           45380, 28, Colonoscopy, flexible; with biopsy,                            single or multiple Diagnosis Code(s):        --- Professional ---                           Z86.010, Personal history of colonic polyps                           K63.5, Polyp of colon                           K64.9, Unspecified hemorrhoids                            K64.4, Residual hemorrhoidal skin tags CPT copyright 2019 American Medical Association. All rights reserved. The codes documented in this report are preliminary and upon coder review may  be revised to meet current compliance requirements. Hildred Laser, MD Hildred Laser, MD 04/16/2020 12:48:36 PM  This report has been signed electronically. Number of Addenda: 0

## 2020-04-16 NOTE — Anesthesia Postprocedure Evaluation (Signed)
Anesthesia Post Note  Patient: Joyce Gomez  Procedure(s) Performed: COLONOSCOPY WITH PROPOFOL (N/A ) ESOPHAGOGASTRODUODENOSCOPY (EGD) WITH PROPOFOL (N/A ) BIOPSY POLYPECTOMY  Patient location during evaluation: PACU Anesthesia Type: General Level of consciousness: awake and alert and oriented Pain management: pain level controlled Vital Signs Assessment: post-procedure vital signs reviewed and stable Respiratory status: spontaneous breathing, nonlabored ventilation and respiratory function stable Cardiovascular status: blood pressure returned to baseline and stable Postop Assessment: no apparent nausea or vomiting Anesthetic complications: no   No complications documented.   Last Vitals:  Vitals:   04/16/20 1245 04/16/20 1300  BP: 109/67 130/81  Pulse: 77 71  Resp: 18 15  Temp:    SpO2: 99% 100%    Last Pain:  Vitals:   04/16/20 1300  TempSrc:   PainSc: 0-No pain                 Orlie Dakin

## 2020-04-16 NOTE — Discharge Instructions (Signed)
No aspirin or NSAIDs for 24 hours. Resume other medications and diet as before. Resume usual diet.   Try to eat supper at least 4 hours before you go to sleep. No driving for 24 hours. Physician will call with biopsy results and further recommendations.  Colonoscopy, Adult, Care After This sheet gives you information about how to care for yourself after your procedure. Your health care provider may also give you more specific instructions. If you have problems or questions, contact your health care provider. What can I expect after the procedure? After the procedure, it is common to have:  A small amount of blood in your stool for 24 hours after the procedure.  Some gas.  Mild cramping or bloating of your abdomen. Follow these instructions at home: Eating and drinking   Drink enough fluid to keep your urine pale yellow.  Follow instructions from your health care provider about eating or drinking restrictions.  Resume your normal diet as instructed by your health care provider. Avoid heavy or fried foods that are hard to digest. Activity  Rest as told by your health care provider.  Avoid sitting for a long time without moving. Get up to take short walks every 1-2 hours. This is important to improve blood flow and breathing. Ask for help if you feel weak or unsteady.  Return to your normal activities as told by your health care provider. Ask your health care provider what activities are safe for you. Managing cramping and bloating   Try walking around when you have cramps or feel bloated.  Apply heat to your abdomen as told by your health care provider. Use the heat source that your health care provider recommends, such as a moist heat pack or a heating pad. ? Place a towel between your skin and the heat source. ? Leave the heat on for 20-30 minutes. ? Remove the heat if your skin turns bright red. This is especially important if you are unable to feel pain, heat, or cold. You  may have a greater risk of getting burned. General instructions  For the first 24 hours after the procedure: ? Do not drive or use machinery. ? Do not sign important documents. ? Do not drink alcohol. ? Do your regular daily activities at a slower pace than normal. ? Eat soft foods that are easy to digest.  Take over-the-counter and prescription medicines only as told by your health care provider.  Keep all follow-up visits as told by your health care provider. This is important. Contact a health care provider if:  You have blood in your stool 2-3 days after the procedure. Get help right away if you have:  More than a small spotting of blood in your stool.  Large blood clots in your stool.  Swelling of your abdomen.  Nausea or vomiting.  A fever.  Increasing pain in your abdomen that is not relieved with medicine. Summary  After the procedure, it is common to have a small amount of blood in your stool. You may also have mild cramping and bloating of your abdomen.  For the first 24 hours after the procedure, do not drive or use machinery, sign important documents, or drink alcohol.  Get help right away if you have a lot of blood in your stool, nausea or vomiting, a fever, or increased pain in your abdomen. This information is not intended to replace advice given to you by your health care provider. Make sure you discuss any questions you have  with your health care provider. Document Revised: 11/27/2018 Document Reviewed: 11/27/2018 Elsevier Patient Education  [place].  Upper Endoscopy, Adult, Care After This sheet gives you information about how to care for yourself after your procedure. Your health care provider may also give you more specific instructions. If you have problems or questions, contact your health care provider. What can I expect after the procedure? After the procedure, it is common to have:  A sore throat.  Mild stomach pain or  discomfort.  Bloating.  Nausea. Follow these instructions at home:   Follow instructions from your health care provider about what to eat or drink after your procedure.  Return to your normal activities as told by your health care provider. Ask your health care provider what activities are safe for you.  Take over-the-counter and prescription medicines only as told by your health care provider.  Do not drive for 24 hours if you were given a sedative during your procedure.  Keep all follow-up visits as told by your health care provider. This is important. Contact a health care provider if you have:  A sore throat that lasts longer than one day.  Trouble swallowing. Get help right away if:  You vomit blood or your vomit looks like coffee grounds.  You have: ? A fever. ? Bloody, black, or tarry stools. ? A severe sore throat or you cannot swallow. ? Difficulty breathing. ? Severe pain in your chest or abdomen. Summary  After the procedure, it is common to have a sore throat, mild stomach discomfort, bloating, and nausea.  Do not drive for 24 hours if you were given a sedative during the procedure.  Follow instructions from your health care provider about what to eat or drink after your procedure.  Return to your normal activities as told by your health care provider. This information is not intended to replace advice given to you by your health care provider. Make sure you discuss any questions you have with your health care provider. Document Revised: 10/25/2017 Document Reviewed: 10/03/2017 Elsevier Patient Education  [place].

## 2020-04-17 ENCOUNTER — Other Ambulatory Visit: Payer: Self-pay

## 2020-04-17 LAB — SURGICAL PATHOLOGY

## 2020-04-22 ENCOUNTER — Telehealth (INDEPENDENT_AMBULATORY_CARE_PROVIDER_SITE_OTHER): Payer: Self-pay

## 2020-04-22 ENCOUNTER — Encounter (HOSPITAL_COMMUNITY): Payer: Self-pay | Admitting: Internal Medicine

## 2020-04-22 DIAGNOSIS — R197 Diarrhea, unspecified: Secondary | ICD-10-CM

## 2020-04-22 DIAGNOSIS — R112 Nausea with vomiting, unspecified: Secondary | ICD-10-CM

## 2020-04-22 NOTE — Telephone Encounter (Signed)
Joyce Gomez, CMA  

## 2020-04-28 ENCOUNTER — Encounter (HOSPITAL_COMMUNITY)
Admission: RE | Admit: 2020-04-28 | Discharge: 2020-04-28 | Disposition: A | Discharge status: Home / Self Care | Payer: Medicare Other | Source: Ambulatory Visit | Attending: Internal Medicine | Admitting: Internal Medicine

## 2020-04-28 ENCOUNTER — Other Ambulatory Visit: Payer: Self-pay

## 2020-04-28 ENCOUNTER — Encounter (HOSPITAL_COMMUNITY): Payer: Self-pay

## 2020-04-28 DIAGNOSIS — R197 Diarrhea, unspecified: Secondary | ICD-10-CM | POA: Insufficient documentation

## 2020-04-28 DIAGNOSIS — R112 Nausea with vomiting, unspecified: Secondary | ICD-10-CM | POA: Insufficient documentation

## 2020-04-28 IMAGING — NM NM GASTRIC EMPTYING
10 series · 10 of 10 positions shown · non-contrast
Comparison: None.

CLINICAL DATA: Nausea and vomiting

EXAM:
NUCLEAR MEDICINE GASTRIC EMPTYING SCAN
TECHNIQUE: After oral ingestion of radiolabeled meal, sequential abdominal
images were obtained for 4 hours. Percentage of activity emptying
the stomach was calculated at 1 hour, 2 hour, 3 hour, and 4 hours.
RADIOPHARMACEUTICALS:  2.1 mCi [TQ] sulfur colloid in standardized
meal

[Series 1: 0 min · 4.14mm/px · 1 of 1 slices shown (1 of 2)]
[im 1/1]
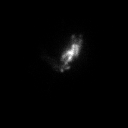

[Series 1: 0 min · 4.14mm/px · 1 of 1 slices shown (2 of 2)]
[im 1/1]
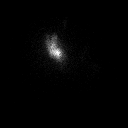

[Series 2: 60 min · 4.14mm/px · 1 of 1 slices shown (1 of 2)]
[im 1/1]
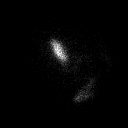

[Series 2: 60 min · 4.14mm/px · 1 of 1 slices shown (2 of 2)]
[im 1/1]
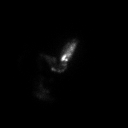

[Series 3: 120 min · 4.14mm/px · 1 of 1 slices shown (1 of 2)]
[im 1/1  full-range]
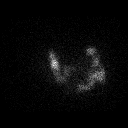

[Series 3: 120 min · 4.14mm/px · 1 of 1 slices shown (2 of 2)]
[im 1/1]
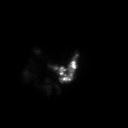

[Series 4: 180 min · 4.14mm/px · 1 of 1 slices shown (1 of 2)]
[im 1/1]
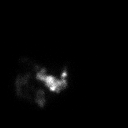

[Series 4: 180 min · 4.14mm/px · 1 of 1 slices shown (2 of 2)]
[im 1/1  full-range]
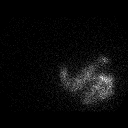

[Series 5: 240 min · 4.14mm/px · 1 of 1 slices shown (1 of 2)]
[im 1/1]
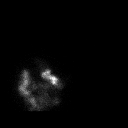

[Series 5: 240 min · 4.14mm/px · 1 of 1 slices shown (2 of 2)]
[im 1/1  full-range]
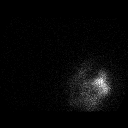

[10 of 10 positions shown; findings below may reference images not displayed]

FINDINGS: Expected location of the stomach in the left upper quadrant.

Ingested meal empties the stomach gradually over the course of the
study.

1% emptied at 1 hr ( normal >= 10%)

13% emptied at 2 hr ( normal >= 40%)

37% emptied at 3 hr ( normal >= 70%)

71% emptied at 4 hr ( normal >= 90%)
IMPRESSION: Delayed gastric emptying study.

## 2020-04-28 MED ORDER — TECHNETIUM TC 99M SULFUR COLLOID
2.0000 | Freq: Once | INTRAVENOUS | Status: AC | PRN
  Administered 2020-04-28: 2.1 via ORAL

## 2020-04-28 MED ORDER — TECHNETIUM TC 99M SULFUR COLLOID: 2.0000 | Freq: Once | INTRAVENOUS | Status: DC | PRN

## 2020-05-05 ENCOUNTER — Telehealth (INDEPENDENT_AMBULATORY_CARE_PROVIDER_SITE_OTHER): Payer: Self-pay | Admitting: Internal Medicine

## 2020-05-05 NOTE — Telephone Encounter (Signed)
patient left voice mail message regarding test results - please advise - ph# [phone]

## 2020-05-06 NOTE — Telephone Encounter (Signed)
Per Dr. Laural Golden Patient has a slow stomach, eat less food, send gastroparesis diet. Start Domperidone 10 mg BID . [place] . Per Dr. Laural Golden this medication will have to be paid for by the patient as insurance does not cover.

## 2020-05-06 NOTE — Telephone Encounter (Signed)
Patient is wanting the results of the nm gastric emptying study.

## 2020-05-06 NOTE — Telephone Encounter (Signed)
I called and left a vm asked that if she still needed our assistance to please call the office.

## 2020-05-06 NOTE — Telephone Encounter (Signed)
Patient aware of all, verbally gave the patient the medication name and the phone number along with directions on how to take it BID before breakfast and supper. Also mailed a gastroparesis diet to her along with the medication information.

## 2020-05-13 ENCOUNTER — Telehealth (INDEPENDENT_AMBULATORY_CARE_PROVIDER_SITE_OTHER): Payer: Self-pay

## 2020-05-13 NOTE — Telephone Encounter (Signed)
I called and left a message to return call.   Per Dr. Karilyn Cota the medication should not cause any issues with her heart. If palpitations occur she can decrease the dose to 1/2 a pill per day. Also it is ok to start the medication when it is does arrive.

## 2020-05-13 NOTE — Telephone Encounter (Signed)
Patient called today regarding the Domperidone that was recommended for her last week. She states she has reached out to the people whom supply it and she has several questions.  #1. She states she had read the medication can have an affect on the heart and she has a history of a heart murmer and wondered if this would harm her.  #2. She states she will have to use a check to mail to the people to get the medication, and it will be 4-6 weeks before she receives the medication is it ok to wait this long for the medication?

## 2020-05-14 NOTE — Telephone Encounter (Signed)
I called and left a vm asked that the patient please return call. 

## 2020-05-19 NOTE — Telephone Encounter (Signed)
Patient is aware of all. 

## 2020-06-18 ENCOUNTER — Encounter (INDEPENDENT_AMBULATORY_CARE_PROVIDER_SITE_OTHER): Payer: Self-pay

## 2020-06-25 ENCOUNTER — Telehealth (INDEPENDENT_AMBULATORY_CARE_PROVIDER_SITE_OTHER): Payer: Self-pay | Admitting: Internal Medicine

## 2020-06-25 NOTE — Telephone Encounter (Signed)
Patient left voice mail message stating she has received her domperidone - she has some questions regarding other medications - please advise - ph# [phone]

## 2020-06-26 NOTE — Telephone Encounter (Signed)
Patient states she has received the domperidone and she was started on Carfate and pantoprazole by Dr. Freida Busman and she wants to know if she should continue these medications while on domperidone. Please advise.

## 2020-06-26 NOTE — Telephone Encounter (Signed)
Per Dr. Laural Golden patient can stop Carafate, but continue the pantoprazole. Patient aware of all.

## 2021-07-15 DIAGNOSIS — I4891 Unspecified atrial fibrillation: Secondary | ICD-10-CM | POA: Diagnosis not present

## 2021-07-15 DIAGNOSIS — J441 Chronic obstructive pulmonary disease with (acute) exacerbation: Secondary | ICD-10-CM | POA: Diagnosis not present

## 2021-07-15 DIAGNOSIS — J9602 Acute respiratory failure with hypercapnia: Secondary | ICD-10-CM | POA: Diagnosis not present

## 2021-07-16 DIAGNOSIS — J441 Chronic obstructive pulmonary disease with (acute) exacerbation: Secondary | ICD-10-CM | POA: Diagnosis not present

## 2021-07-16 DIAGNOSIS — J9602 Acute respiratory failure with hypercapnia: Secondary | ICD-10-CM | POA: Diagnosis not present

## 2021-07-17 DIAGNOSIS — J9602 Acute respiratory failure with hypercapnia: Secondary | ICD-10-CM | POA: Diagnosis not present

## 2021-07-17 DIAGNOSIS — J441 Chronic obstructive pulmonary disease with (acute) exacerbation: Secondary | ICD-10-CM | POA: Diagnosis not present

## 2021-07-18 DIAGNOSIS — J441 Chronic obstructive pulmonary disease with (acute) exacerbation: Secondary | ICD-10-CM | POA: Diagnosis not present

## 2021-07-18 DIAGNOSIS — J9602 Acute respiratory failure with hypercapnia: Secondary | ICD-10-CM | POA: Diagnosis not present

## 2021-07-19 DIAGNOSIS — J9602 Acute respiratory failure with hypercapnia: Secondary | ICD-10-CM | POA: Diagnosis not present

## 2021-07-19 DIAGNOSIS — J441 Chronic obstructive pulmonary disease with (acute) exacerbation: Secondary | ICD-10-CM | POA: Diagnosis not present

## 2021-07-20 DIAGNOSIS — M797 Fibromyalgia: Secondary | ICD-10-CM | POA: Diagnosis not present

## 2021-07-20 DIAGNOSIS — M6281 Muscle weakness (generalized): Secondary | ICD-10-CM | POA: Diagnosis not present

## 2021-07-20 DIAGNOSIS — E1122 Type 2 diabetes mellitus with diabetic chronic kidney disease: Secondary | ICD-10-CM | POA: Diagnosis not present

## 2021-07-20 DIAGNOSIS — Z760 Encounter for issue of repeat prescription: Secondary | ICD-10-CM | POA: Diagnosis not present

## 2021-07-20 DIAGNOSIS — S81802D Unspecified open wound, left lower leg, subsequent encounter: Secondary | ICD-10-CM | POA: Diagnosis not present

## 2021-07-20 DIAGNOSIS — L03116 Cellulitis of left lower limb: Secondary | ICD-10-CM | POA: Diagnosis not present

## 2021-07-20 DIAGNOSIS — G4733 Obstructive sleep apnea (adult) (pediatric): Secondary | ICD-10-CM | POA: Diagnosis not present

## 2021-07-20 DIAGNOSIS — R262 Difficulty in walking, not elsewhere classified: Secondary | ICD-10-CM | POA: Diagnosis not present

## 2021-07-20 DIAGNOSIS — E43 Unspecified severe protein-calorie malnutrition: Secondary | ICD-10-CM | POA: Diagnosis not present

## 2021-07-20 DIAGNOSIS — S91302D Unspecified open wound, left foot, subsequent encounter: Secondary | ICD-10-CM | POA: Diagnosis not present

## 2021-07-20 DIAGNOSIS — L120 Bullous pemphigoid: Secondary | ICD-10-CM | POA: Diagnosis not present

## 2021-07-20 DIAGNOSIS — I10 Essential (primary) hypertension: Secondary | ICD-10-CM | POA: Diagnosis not present

## 2021-07-20 DIAGNOSIS — J9690 Respiratory failure, unspecified, unspecified whether with hypoxia or hypercapnia: Secondary | ICD-10-CM | POA: Diagnosis not present

## 2021-07-20 DIAGNOSIS — F339 Major depressive disorder, recurrent, unspecified: Secondary | ICD-10-CM | POA: Diagnosis not present

## 2021-07-20 DIAGNOSIS — E119 Type 2 diabetes mellitus without complications: Secondary | ICD-10-CM | POA: Diagnosis not present

## 2021-07-20 DIAGNOSIS — K219 Gastro-esophageal reflux disease without esophagitis: Secondary | ICD-10-CM | POA: Diagnosis not present

## 2021-07-20 DIAGNOSIS — J209 Acute bronchitis, unspecified: Secondary | ICD-10-CM | POA: Diagnosis not present

## 2021-07-20 DIAGNOSIS — J9602 Acute respiratory failure with hypercapnia: Secondary | ICD-10-CM | POA: Diagnosis not present

## 2021-07-20 DIAGNOSIS — J9611 Chronic respiratory failure with hypoxia: Secondary | ICD-10-CM | POA: Diagnosis not present

## 2021-07-20 DIAGNOSIS — R609 Edema, unspecified: Secondary | ICD-10-CM | POA: Diagnosis not present

## 2021-07-20 DIAGNOSIS — I480 Paroxysmal atrial fibrillation: Secondary | ICD-10-CM | POA: Diagnosis not present

## 2021-07-20 DIAGNOSIS — J449 Chronic obstructive pulmonary disease, unspecified: Secondary | ICD-10-CM | POA: Diagnosis not present

## 2021-07-20 DIAGNOSIS — F419 Anxiety disorder, unspecified: Secondary | ICD-10-CM | POA: Diagnosis not present

## 2021-07-20 DIAGNOSIS — E039 Hypothyroidism, unspecified: Secondary | ICD-10-CM | POA: Diagnosis not present

## 2021-07-20 DIAGNOSIS — N179 Acute kidney failure, unspecified: Secondary | ICD-10-CM | POA: Diagnosis not present

## 2021-07-20 DIAGNOSIS — J441 Chronic obstructive pulmonary disease with (acute) exacerbation: Secondary | ICD-10-CM | POA: Diagnosis not present

## 2021-07-20 DIAGNOSIS — S81801D Unspecified open wound, right lower leg, subsequent encounter: Secondary | ICD-10-CM | POA: Diagnosis not present

## 2021-07-20 DIAGNOSIS — E785 Hyperlipidemia, unspecified: Secondary | ICD-10-CM | POA: Diagnosis not present

## 2021-07-22 DIAGNOSIS — M797 Fibromyalgia: Secondary | ICD-10-CM | POA: Diagnosis not present

## 2021-07-22 DIAGNOSIS — J449 Chronic obstructive pulmonary disease, unspecified: Secondary | ICD-10-CM | POA: Diagnosis not present

## 2021-07-22 DIAGNOSIS — I10 Essential (primary) hypertension: Secondary | ICD-10-CM | POA: Diagnosis not present

## 2021-07-22 DIAGNOSIS — R609 Edema, unspecified: Secondary | ICD-10-CM | POA: Diagnosis not present

## 2021-07-23 DIAGNOSIS — S91302D Unspecified open wound, left foot, subsequent encounter: Secondary | ICD-10-CM | POA: Diagnosis not present

## 2021-07-23 DIAGNOSIS — S81801D Unspecified open wound, right lower leg, subsequent encounter: Secondary | ICD-10-CM | POA: Diagnosis not present

## 2021-07-23 DIAGNOSIS — L120 Bullous pemphigoid: Secondary | ICD-10-CM | POA: Diagnosis not present

## 2021-07-23 DIAGNOSIS — S81802D Unspecified open wound, left lower leg, subsequent encounter: Secondary | ICD-10-CM | POA: Diagnosis not present

## 2021-07-27 DIAGNOSIS — M797 Fibromyalgia: Secondary | ICD-10-CM | POA: Diagnosis not present

## 2021-07-27 DIAGNOSIS — L120 Bullous pemphigoid: Secondary | ICD-10-CM | POA: Diagnosis not present

## 2021-07-27 DIAGNOSIS — M6281 Muscle weakness (generalized): Secondary | ICD-10-CM | POA: Diagnosis not present

## 2021-07-27 DIAGNOSIS — E039 Hypothyroidism, unspecified: Secondary | ICD-10-CM | POA: Diagnosis not present

## 2021-07-27 DIAGNOSIS — I10 Essential (primary) hypertension: Secondary | ICD-10-CM | POA: Diagnosis not present

## 2021-07-27 DIAGNOSIS — J449 Chronic obstructive pulmonary disease, unspecified: Secondary | ICD-10-CM | POA: Diagnosis not present

## 2021-07-28 DIAGNOSIS — J449 Chronic obstructive pulmonary disease, unspecified: Secondary | ICD-10-CM | POA: Diagnosis not present

## 2021-07-28 DIAGNOSIS — M6281 Muscle weakness (generalized): Secondary | ICD-10-CM | POA: Diagnosis not present

## 2021-07-28 DIAGNOSIS — J9611 Chronic respiratory failure with hypoxia: Secondary | ICD-10-CM | POA: Diagnosis not present

## 2021-07-30 DIAGNOSIS — L120 Bullous pemphigoid: Secondary | ICD-10-CM | POA: Diagnosis not present

## 2021-07-30 DIAGNOSIS — S81802D Unspecified open wound, left lower leg, subsequent encounter: Secondary | ICD-10-CM | POA: Diagnosis not present

## 2021-07-30 DIAGNOSIS — S91302D Unspecified open wound, left foot, subsequent encounter: Secondary | ICD-10-CM | POA: Diagnosis not present

## 2021-07-30 DIAGNOSIS — E119 Type 2 diabetes mellitus without complications: Secondary | ICD-10-CM | POA: Diagnosis not present

## 2021-07-31 DIAGNOSIS — J9611 Chronic respiratory failure with hypoxia: Secondary | ICD-10-CM | POA: Diagnosis not present

## 2021-07-31 DIAGNOSIS — Z760 Encounter for issue of repeat prescription: Secondary | ICD-10-CM | POA: Diagnosis not present

## 2021-07-31 DIAGNOSIS — J449 Chronic obstructive pulmonary disease, unspecified: Secondary | ICD-10-CM | POA: Diagnosis not present

## 2021-07-31 DIAGNOSIS — F419 Anxiety disorder, unspecified: Secondary | ICD-10-CM | POA: Diagnosis not present

## 2021-08-03 DIAGNOSIS — J209 Acute bronchitis, unspecified: Secondary | ICD-10-CM | POA: Diagnosis not present

## 2021-08-03 DIAGNOSIS — I10 Essential (primary) hypertension: Secondary | ICD-10-CM | POA: Diagnosis not present

## 2021-08-03 DIAGNOSIS — E785 Hyperlipidemia, unspecified: Secondary | ICD-10-CM | POA: Diagnosis not present

## 2021-08-03 DIAGNOSIS — E039 Hypothyroidism, unspecified: Secondary | ICD-10-CM | POA: Diagnosis not present

## 2021-08-03 DIAGNOSIS — I480 Paroxysmal atrial fibrillation: Secondary | ICD-10-CM | POA: Diagnosis not present

## 2021-08-03 DIAGNOSIS — J449 Chronic obstructive pulmonary disease, unspecified: Secondary | ICD-10-CM | POA: Diagnosis not present

## 2021-08-03 DIAGNOSIS — J9611 Chronic respiratory failure with hypoxia: Secondary | ICD-10-CM | POA: Diagnosis not present

## 2021-08-03 DIAGNOSIS — F339 Major depressive disorder, recurrent, unspecified: Secondary | ICD-10-CM | POA: Diagnosis not present

## 2021-08-03 DIAGNOSIS — K219 Gastro-esophageal reflux disease without esophagitis: Secondary | ICD-10-CM | POA: Diagnosis not present

## 2021-08-07 DIAGNOSIS — E876 Hypokalemia: Secondary | ICD-10-CM | POA: Diagnosis not present

## 2021-08-07 DIAGNOSIS — R531 Weakness: Secondary | ICD-10-CM | POA: Diagnosis not present

## 2021-08-07 DIAGNOSIS — K219 Gastro-esophageal reflux disease without esophagitis: Secondary | ICD-10-CM | POA: Diagnosis not present

## 2021-08-07 DIAGNOSIS — E1122 Type 2 diabetes mellitus with diabetic chronic kidney disease: Secondary | ICD-10-CM | POA: Diagnosis not present

## 2021-08-07 DIAGNOSIS — R443 Hallucinations, unspecified: Secondary | ICD-10-CM | POA: Diagnosis not present

## 2021-08-07 DIAGNOSIS — R262 Difficulty in walking, not elsewhere classified: Secondary | ICD-10-CM | POA: Diagnosis not present

## 2021-08-07 DIAGNOSIS — I10 Essential (primary) hypertension: Secondary | ICD-10-CM | POA: Diagnosis not present

## 2021-08-07 DIAGNOSIS — E43 Unspecified severe protein-calorie malnutrition: Secondary | ICD-10-CM | POA: Diagnosis not present

## 2021-08-07 DIAGNOSIS — E039 Hypothyroidism, unspecified: Secondary | ICD-10-CM | POA: Diagnosis not present

## 2021-08-07 DIAGNOSIS — I480 Paroxysmal atrial fibrillation: Secondary | ICD-10-CM | POA: Diagnosis not present

## 2021-08-07 DIAGNOSIS — R0600 Dyspnea, unspecified: Secondary | ICD-10-CM | POA: Diagnosis not present

## 2021-08-07 DIAGNOSIS — Z6841 Body Mass Index (BMI) 40.0 and over, adult: Secondary | ICD-10-CM | POA: Diagnosis not present

## 2021-08-07 DIAGNOSIS — M6281 Muscle weakness (generalized): Secondary | ICD-10-CM | POA: Diagnosis not present

## 2021-08-07 DIAGNOSIS — R410 Disorientation, unspecified: Secondary | ICD-10-CM | POA: Diagnosis not present

## 2021-08-07 DIAGNOSIS — J9611 Chronic respiratory failure with hypoxia: Secondary | ICD-10-CM | POA: Diagnosis not present

## 2021-08-07 DIAGNOSIS — M797 Fibromyalgia: Secondary | ICD-10-CM | POA: Diagnosis not present

## 2021-08-07 DIAGNOSIS — J441 Chronic obstructive pulmonary disease with (acute) exacerbation: Secondary | ICD-10-CM | POA: Diagnosis not present

## 2021-08-07 DIAGNOSIS — N179 Acute kidney failure, unspecified: Secondary | ICD-10-CM | POA: Diagnosis not present

## 2021-08-07 DIAGNOSIS — J449 Chronic obstructive pulmonary disease, unspecified: Secondary | ICD-10-CM | POA: Diagnosis not present

## 2021-08-12 DIAGNOSIS — R0989 Other specified symptoms and signs involving the circulatory and respiratory systems: Secondary | ICD-10-CM | POA: Diagnosis not present

## 2021-08-12 DIAGNOSIS — M797 Fibromyalgia: Secondary | ICD-10-CM | POA: Diagnosis not present

## 2021-08-12 DIAGNOSIS — I480 Paroxysmal atrial fibrillation: Secondary | ICD-10-CM | POA: Diagnosis not present

## 2021-08-12 DIAGNOSIS — R531 Weakness: Secondary | ICD-10-CM | POA: Diagnosis not present

## 2021-08-12 DIAGNOSIS — J449 Chronic obstructive pulmonary disease, unspecified: Secondary | ICD-10-CM | POA: Diagnosis not present

## 2021-08-12 DIAGNOSIS — E43 Unspecified severe protein-calorie malnutrition: Secondary | ICD-10-CM | POA: Diagnosis not present

## 2021-08-12 DIAGNOSIS — J441 Chronic obstructive pulmonary disease with (acute) exacerbation: Secondary | ICD-10-CM | POA: Diagnosis not present

## 2021-08-12 DIAGNOSIS — U071 COVID-19: Secondary | ICD-10-CM | POA: Diagnosis not present

## 2021-08-12 DIAGNOSIS — N179 Acute kidney failure, unspecified: Secondary | ICD-10-CM | POA: Diagnosis not present

## 2021-08-12 DIAGNOSIS — J209 Acute bronchitis, unspecified: Secondary | ICD-10-CM | POA: Diagnosis not present

## 2021-08-12 DIAGNOSIS — I10 Essential (primary) hypertension: Secondary | ICD-10-CM | POA: Diagnosis not present

## 2021-08-12 DIAGNOSIS — J9611 Chronic respiratory failure with hypoxia: Secondary | ICD-10-CM | POA: Diagnosis not present

## 2021-08-12 DIAGNOSIS — E1122 Type 2 diabetes mellitus with diabetic chronic kidney disease: Secondary | ICD-10-CM | POA: Diagnosis not present

## 2021-08-12 DIAGNOSIS — K219 Gastro-esophageal reflux disease without esophagitis: Secondary | ICD-10-CM | POA: Diagnosis not present

## 2021-08-12 DIAGNOSIS — Z6841 Body Mass Index (BMI) 40.0 and over, adult: Secondary | ICD-10-CM | POA: Diagnosis not present

## 2021-08-12 DIAGNOSIS — R609 Edema, unspecified: Secondary | ICD-10-CM | POA: Diagnosis not present

## 2021-08-12 DIAGNOSIS — R262 Difficulty in walking, not elsewhere classified: Secondary | ICD-10-CM | POA: Diagnosis not present

## 2021-08-12 DIAGNOSIS — E876 Hypokalemia: Secondary | ICD-10-CM | POA: Diagnosis not present

## 2021-08-12 DIAGNOSIS — G4733 Obstructive sleep apnea (adult) (pediatric): Secondary | ICD-10-CM | POA: Diagnosis not present

## 2021-08-12 DIAGNOSIS — M6281 Muscle weakness (generalized): Secondary | ICD-10-CM | POA: Diagnosis not present

## 2021-08-12 DIAGNOSIS — E039 Hypothyroidism, unspecified: Secondary | ICD-10-CM | POA: Diagnosis not present

## 2021-08-13 DIAGNOSIS — R609 Edema, unspecified: Secondary | ICD-10-CM | POA: Diagnosis not present

## 2021-08-13 DIAGNOSIS — J209 Acute bronchitis, unspecified: Secondary | ICD-10-CM | POA: Diagnosis not present

## 2021-08-13 DIAGNOSIS — J449 Chronic obstructive pulmonary disease, unspecified: Secondary | ICD-10-CM | POA: Diagnosis not present

## 2021-08-13 DIAGNOSIS — J9611 Chronic respiratory failure with hypoxia: Secondary | ICD-10-CM | POA: Diagnosis not present

## 2021-08-17 DIAGNOSIS — R609 Edema, unspecified: Secondary | ICD-10-CM | POA: Diagnosis not present

## 2021-08-17 DIAGNOSIS — U071 COVID-19: Secondary | ICD-10-CM | POA: Diagnosis not present

## 2021-08-17 DIAGNOSIS — J9611 Chronic respiratory failure with hypoxia: Secondary | ICD-10-CM | POA: Diagnosis not present

## 2021-08-17 DIAGNOSIS — J449 Chronic obstructive pulmonary disease, unspecified: Secondary | ICD-10-CM | POA: Diagnosis not present

## 2021-08-18 DIAGNOSIS — E039 Hypothyroidism, unspecified: Secondary | ICD-10-CM | POA: Diagnosis not present

## 2021-08-18 DIAGNOSIS — J449 Chronic obstructive pulmonary disease, unspecified: Secondary | ICD-10-CM | POA: Diagnosis not present

## 2021-08-18 DIAGNOSIS — M6281 Muscle weakness (generalized): Secondary | ICD-10-CM | POA: Diagnosis not present

## 2021-08-18 DIAGNOSIS — I10 Essential (primary) hypertension: Secondary | ICD-10-CM | POA: Diagnosis not present

## 2021-08-18 DIAGNOSIS — I480 Paroxysmal atrial fibrillation: Secondary | ICD-10-CM | POA: Diagnosis not present

## 2021-08-19 DIAGNOSIS — J9611 Chronic respiratory failure with hypoxia: Secondary | ICD-10-CM | POA: Diagnosis not present

## 2021-08-19 DIAGNOSIS — U071 COVID-19: Secondary | ICD-10-CM | POA: Diagnosis not present

## 2021-08-19 DIAGNOSIS — J449 Chronic obstructive pulmonary disease, unspecified: Secondary | ICD-10-CM | POA: Diagnosis not present

## 2021-08-19 DIAGNOSIS — R609 Edema, unspecified: Secondary | ICD-10-CM | POA: Diagnosis not present

## 2021-08-20 DIAGNOSIS — R609 Edema, unspecified: Secondary | ICD-10-CM | POA: Diagnosis not present

## 2021-08-20 DIAGNOSIS — J449 Chronic obstructive pulmonary disease, unspecified: Secondary | ICD-10-CM | POA: Diagnosis not present

## 2021-08-20 DIAGNOSIS — U071 COVID-19: Secondary | ICD-10-CM | POA: Diagnosis not present

## 2021-08-20 DIAGNOSIS — J9611 Chronic respiratory failure with hypoxia: Secondary | ICD-10-CM | POA: Diagnosis not present

## 2021-08-21 DIAGNOSIS — U071 COVID-19: Secondary | ICD-10-CM | POA: Diagnosis not present

## 2021-08-21 DIAGNOSIS — J449 Chronic obstructive pulmonary disease, unspecified: Secondary | ICD-10-CM | POA: Diagnosis not present

## 2021-08-21 DIAGNOSIS — R609 Edema, unspecified: Secondary | ICD-10-CM | POA: Diagnosis not present

## 2021-08-21 DIAGNOSIS — J9611 Chronic respiratory failure with hypoxia: Secondary | ICD-10-CM | POA: Diagnosis not present

## 2021-08-24 DIAGNOSIS — J449 Chronic obstructive pulmonary disease, unspecified: Secondary | ICD-10-CM | POA: Diagnosis not present

## 2021-08-24 DIAGNOSIS — J9611 Chronic respiratory failure with hypoxia: Secondary | ICD-10-CM | POA: Diagnosis not present

## 2021-08-24 DIAGNOSIS — U071 COVID-19: Secondary | ICD-10-CM | POA: Diagnosis not present

## 2021-08-25 DIAGNOSIS — J9611 Chronic respiratory failure with hypoxia: Secondary | ICD-10-CM | POA: Diagnosis not present

## 2021-08-25 DIAGNOSIS — U071 COVID-19: Secondary | ICD-10-CM | POA: Diagnosis not present

## 2021-08-25 DIAGNOSIS — I10 Essential (primary) hypertension: Secondary | ICD-10-CM | POA: Diagnosis not present

## 2021-08-25 DIAGNOSIS — J449 Chronic obstructive pulmonary disease, unspecified: Secondary | ICD-10-CM | POA: Diagnosis not present

## 2021-08-26 DIAGNOSIS — J209 Acute bronchitis, unspecified: Secondary | ICD-10-CM | POA: Diagnosis not present

## 2021-08-26 DIAGNOSIS — U071 COVID-19: Secondary | ICD-10-CM | POA: Diagnosis not present

## 2021-08-27 DIAGNOSIS — U071 COVID-19: Secondary | ICD-10-CM | POA: Diagnosis not present

## 2021-08-27 DIAGNOSIS — J449 Chronic obstructive pulmonary disease, unspecified: Secondary | ICD-10-CM | POA: Diagnosis not present

## 2021-08-27 DIAGNOSIS — J209 Acute bronchitis, unspecified: Secondary | ICD-10-CM | POA: Diagnosis not present

## 2021-08-28 DIAGNOSIS — U071 COVID-19: Secondary | ICD-10-CM | POA: Diagnosis not present

## 2021-08-28 DIAGNOSIS — J209 Acute bronchitis, unspecified: Secondary | ICD-10-CM | POA: Diagnosis not present

## 2021-08-28 DIAGNOSIS — J449 Chronic obstructive pulmonary disease, unspecified: Secondary | ICD-10-CM | POA: Diagnosis not present

## 2021-09-07 DIAGNOSIS — F0150 Vascular dementia without behavioral disturbance: Secondary | ICD-10-CM | POA: Diagnosis not present

## 2021-09-07 DIAGNOSIS — Z6841 Body Mass Index (BMI) 40.0 and over, adult: Secondary | ICD-10-CM | POA: Diagnosis not present

## 2021-09-07 DIAGNOSIS — E038 Other specified hypothyroidism: Secondary | ICD-10-CM | POA: Diagnosis not present

## 2021-09-07 DIAGNOSIS — I872 Venous insufficiency (chronic) (peripheral): Secondary | ICD-10-CM | POA: Diagnosis not present

## 2021-09-07 DIAGNOSIS — J449 Chronic obstructive pulmonary disease, unspecified: Secondary | ICD-10-CM | POA: Diagnosis not present

## 2021-09-07 DIAGNOSIS — M797 Fibromyalgia: Secondary | ICD-10-CM | POA: Diagnosis not present

## 2021-09-07 DIAGNOSIS — I4821 Permanent atrial fibrillation: Secondary | ICD-10-CM | POA: Diagnosis not present

## 2021-09-13 DIAGNOSIS — G4733 Obstructive sleep apnea (adult) (pediatric): Secondary | ICD-10-CM | POA: Diagnosis not present

## 2021-10-13 DIAGNOSIS — G4733 Obstructive sleep apnea (adult) (pediatric): Secondary | ICD-10-CM | POA: Diagnosis not present

## 2021-11-13 DIAGNOSIS — G4733 Obstructive sleep apnea (adult) (pediatric): Secondary | ICD-10-CM | POA: Diagnosis not present

## 2021-12-07 DIAGNOSIS — M797 Fibromyalgia: Secondary | ICD-10-CM | POA: Diagnosis not present

## 2021-12-07 DIAGNOSIS — J449 Chronic obstructive pulmonary disease, unspecified: Secondary | ICD-10-CM | POA: Diagnosis not present

## 2021-12-07 DIAGNOSIS — I5032 Chronic diastolic (congestive) heart failure: Secondary | ICD-10-CM | POA: Diagnosis not present

## 2021-12-07 DIAGNOSIS — Z0000 Encounter for general adult medical examination without abnormal findings: Secondary | ICD-10-CM | POA: Diagnosis not present

## 2021-12-07 DIAGNOSIS — I4821 Permanent atrial fibrillation: Secondary | ICD-10-CM | POA: Diagnosis not present

## 2021-12-07 DIAGNOSIS — I872 Venous insufficiency (chronic) (peripheral): Secondary | ICD-10-CM | POA: Diagnosis not present

## 2021-12-07 DIAGNOSIS — F0150 Vascular dementia without behavioral disturbance: Secondary | ICD-10-CM | POA: Diagnosis not present

## 2021-12-07 DIAGNOSIS — Z6839 Body mass index (BMI) 39.0-39.9, adult: Secondary | ICD-10-CM | POA: Diagnosis not present

## 2021-12-13 DIAGNOSIS — G4733 Obstructive sleep apnea (adult) (pediatric): Secondary | ICD-10-CM | POA: Diagnosis not present

## 2021-12-30 DIAGNOSIS — I350 Nonrheumatic aortic (valve) stenosis: Secondary | ICD-10-CM | POA: Diagnosis not present

## 2021-12-30 DIAGNOSIS — J441 Chronic obstructive pulmonary disease with (acute) exacerbation: Secondary | ICD-10-CM | POA: Diagnosis not present

## 2021-12-30 DIAGNOSIS — I480 Paroxysmal atrial fibrillation: Secondary | ICD-10-CM | POA: Diagnosis not present

## 2021-12-30 DIAGNOSIS — R9431 Abnormal electrocardiogram [ECG] [EKG]: Secondary | ICD-10-CM | POA: Diagnosis not present

## 2022-01-13 DIAGNOSIS — G4733 Obstructive sleep apnea (adult) (pediatric): Secondary | ICD-10-CM | POA: Diagnosis not present

## 2022-01-26 DIAGNOSIS — M810 Age-related osteoporosis without current pathological fracture: Secondary | ICD-10-CM | POA: Diagnosis not present

## 2022-01-27 DIAGNOSIS — M5441 Lumbago with sciatica, right side: Secondary | ICD-10-CM | POA: Diagnosis not present

## 2022-01-27 DIAGNOSIS — M9905 Segmental and somatic dysfunction of pelvic region: Secondary | ICD-10-CM | POA: Diagnosis not present

## 2022-01-27 DIAGNOSIS — M5442 Lumbago with sciatica, left side: Secondary | ICD-10-CM | POA: Diagnosis not present

## 2022-01-27 DIAGNOSIS — M9903 Segmental and somatic dysfunction of lumbar region: Secondary | ICD-10-CM | POA: Diagnosis not present

## 2022-01-28 DIAGNOSIS — M9905 Segmental and somatic dysfunction of pelvic region: Secondary | ICD-10-CM | POA: Diagnosis not present

## 2022-01-28 DIAGNOSIS — M9903 Segmental and somatic dysfunction of lumbar region: Secondary | ICD-10-CM | POA: Diagnosis not present

## 2022-01-28 DIAGNOSIS — M5442 Lumbago with sciatica, left side: Secondary | ICD-10-CM | POA: Diagnosis not present

## 2022-01-28 DIAGNOSIS — M5441 Lumbago with sciatica, right side: Secondary | ICD-10-CM | POA: Diagnosis not present

## 2022-01-29 DIAGNOSIS — F419 Anxiety disorder, unspecified: Secondary | ICD-10-CM | POA: Diagnosis not present

## 2022-01-29 DIAGNOSIS — I10 Essential (primary) hypertension: Secondary | ICD-10-CM | POA: Diagnosis not present

## 2022-01-29 DIAGNOSIS — E039 Hypothyroidism, unspecified: Secondary | ICD-10-CM | POA: Diagnosis not present

## 2022-01-29 DIAGNOSIS — K219 Gastro-esophageal reflux disease without esophagitis: Secondary | ICD-10-CM | POA: Diagnosis not present

## 2022-01-29 DIAGNOSIS — L0100 Impetigo, unspecified: Secondary | ICD-10-CM | POA: Diagnosis not present

## 2022-01-29 DIAGNOSIS — Z888 Allergy status to other drugs, medicaments and biological substances status: Secondary | ICD-10-CM | POA: Diagnosis not present

## 2022-01-29 DIAGNOSIS — E119 Type 2 diabetes mellitus without complications: Secondary | ICD-10-CM | POA: Diagnosis not present

## 2022-01-29 DIAGNOSIS — Z90710 Acquired absence of both cervix and uterus: Secondary | ICD-10-CM | POA: Diagnosis not present

## 2022-01-29 DIAGNOSIS — R21 Rash and other nonspecific skin eruption: Secondary | ICD-10-CM | POA: Diagnosis not present

## 2022-02-02 DIAGNOSIS — M5441 Lumbago with sciatica, right side: Secondary | ICD-10-CM | POA: Diagnosis not present

## 2022-02-02 DIAGNOSIS — M9905 Segmental and somatic dysfunction of pelvic region: Secondary | ICD-10-CM | POA: Diagnosis not present

## 2022-02-02 DIAGNOSIS — M9903 Segmental and somatic dysfunction of lumbar region: Secondary | ICD-10-CM | POA: Diagnosis not present

## 2022-02-02 DIAGNOSIS — M5442 Lumbago with sciatica, left side: Secondary | ICD-10-CM | POA: Diagnosis not present

## 2022-02-04 DIAGNOSIS — M9903 Segmental and somatic dysfunction of lumbar region: Secondary | ICD-10-CM | POA: Diagnosis not present

## 2022-02-04 DIAGNOSIS — M9905 Segmental and somatic dysfunction of pelvic region: Secondary | ICD-10-CM | POA: Diagnosis not present

## 2022-02-04 DIAGNOSIS — M5442 Lumbago with sciatica, left side: Secondary | ICD-10-CM | POA: Diagnosis not present

## 2022-02-04 DIAGNOSIS — M5441 Lumbago with sciatica, right side: Secondary | ICD-10-CM | POA: Diagnosis not present

## 2022-02-09 DIAGNOSIS — E876 Hypokalemia: Secondary | ICD-10-CM | POA: Diagnosis not present

## 2022-02-09 DIAGNOSIS — M5442 Lumbago with sciatica, left side: Secondary | ICD-10-CM | POA: Diagnosis not present

## 2022-02-09 DIAGNOSIS — M5441 Lumbago with sciatica, right side: Secondary | ICD-10-CM | POA: Diagnosis not present

## 2022-02-09 DIAGNOSIS — N179 Acute kidney failure, unspecified: Secondary | ICD-10-CM | POA: Diagnosis not present

## 2022-02-09 DIAGNOSIS — M9905 Segmental and somatic dysfunction of pelvic region: Secondary | ICD-10-CM | POA: Diagnosis not present

## 2022-02-09 DIAGNOSIS — I10 Essential (primary) hypertension: Secondary | ICD-10-CM | POA: Diagnosis not present

## 2022-02-09 DIAGNOSIS — R609 Edema, unspecified: Secondary | ICD-10-CM | POA: Diagnosis not present

## 2022-02-09 DIAGNOSIS — M9903 Segmental and somatic dysfunction of lumbar region: Secondary | ICD-10-CM | POA: Diagnosis not present

## 2022-02-11 DIAGNOSIS — M9905 Segmental and somatic dysfunction of pelvic region: Secondary | ICD-10-CM | POA: Diagnosis not present

## 2022-02-11 DIAGNOSIS — M9903 Segmental and somatic dysfunction of lumbar region: Secondary | ICD-10-CM | POA: Diagnosis not present

## 2022-02-11 DIAGNOSIS — Z6839 Body mass index (BMI) 39.0-39.9, adult: Secondary | ICD-10-CM | POA: Diagnosis not present

## 2022-02-11 DIAGNOSIS — L0390 Cellulitis, unspecified: Secondary | ICD-10-CM | POA: Diagnosis not present

## 2022-02-11 DIAGNOSIS — M5442 Lumbago with sciatica, left side: Secondary | ICD-10-CM | POA: Diagnosis not present

## 2022-02-11 DIAGNOSIS — M5441 Lumbago with sciatica, right side: Secondary | ICD-10-CM | POA: Diagnosis not present

## 2022-02-13 DIAGNOSIS — G4733 Obstructive sleep apnea (adult) (pediatric): Secondary | ICD-10-CM | POA: Diagnosis not present

## 2022-02-17 DIAGNOSIS — S2001XA Contusion of right breast, initial encounter: Secondary | ICD-10-CM | POA: Diagnosis not present

## 2022-02-17 DIAGNOSIS — Z6838 Body mass index (BMI) 38.0-38.9, adult: Secondary | ICD-10-CM | POA: Diagnosis not present

## 2022-02-18 DIAGNOSIS — M810 Age-related osteoporosis without current pathological fracture: Secondary | ICD-10-CM | POA: Diagnosis not present

## 2022-02-19 ENCOUNTER — Emergency Department
Admission: EM | Admit: 2022-02-19 | Discharge: 2022-02-19 | Disposition: A | Discharge status: Home / Self Care | Payer: Medicare PPO | Attending: Family Medicine | Admitting: Family Medicine

## 2022-02-19 DIAGNOSIS — I10 Essential (primary) hypertension: Secondary | ICD-10-CM | POA: Insufficient documentation

## 2022-02-19 DIAGNOSIS — I4891 Unspecified atrial fibrillation: Secondary | ICD-10-CM | POA: Insufficient documentation

## 2022-02-19 DIAGNOSIS — N6489 Other specified disorders of breast: Secondary | ICD-10-CM | POA: Insufficient documentation

## 2022-02-19 HISTORY — DX: Chronic obstructive pulmonary disease, unspecified: J44.9

## 2022-02-19 HISTORY — DX: Essential (primary) hypertension: I10

## 2022-02-19 HISTORY — DX: Unspecified atrial fibrillation: I48.91

## 2022-02-19 NOTE — ED Provider Notes (Signed)
[facility] EMERGENCY DEPT             Patient Name: Tiffany Mercado, Tiffany Mercado Patient DOB:  24-Mar-1946   Encounter Date:  02/19/2022 Age: 76 y.o. female   Attending ED Physician: Jennette Bill, MD MRN:  [id number]   Room:  ED04/ED04-A PCP: Junius Roads, MD      Diagnosis / Disposition:   Final Impression  1. Hematoma of right breast        Disposition              ED Disposition       ED Disposition   Discharge    Condition   --    Date/Time   Fri Feb 19, 2022  9:05 PM    Comment   Mylene Luevano [facility] discharge to home/self care.    Condition at disposition: Stable                 Follow up  PCP    In 3 days  For reevaluation and further management as needed.    [facility] Emergency Department  [place]  [phone]  In 1 day  If symptoms worsen      Prescriptions  New Prescriptions    No medications on file         History of Presenting Illness:   Chief complaint: Breast Pain      [id number] is a 76 year old with a history of A-fib on Eliquis who came to the ED with a chief complaint of right breast bruising.  She reports that this happened 3 days prior to this visit.  She reports that she initially felt a knot in her right breast.  She has noticed that the bruising has become darker and was concerned and came to the ED for further evaluation.  She denies any shortness of breath, chest pain, abdominal pain, nausea, vomiting or any other acute symptoms.         No notes on file    In addition to the above history, please see nursing notes. Allergies, meds, past medical, family, social hx, and the results of the diagnostic studies performed have been reviewed by myself.      Review of Systems   Review of Systems   Constitutional:  Negative for fatigue and fever.   HENT:  Negative for congestion, drooling and ear pain.    Respiratory:  Negative for cough and shortness of breath.    Cardiovascular:  Negative for chest pain and palpitations.   All other  systems reviewed and are negative.      All other systems reviewed and negative except as above, pertinent findings in HPI.        Allergies / Medications:   Pt is allergic to aspirin.    Current/Home Medications    ALBUTEROL SULFATE HFA (VENTOLIN HFA) 108 (90 BASE) MCG/ACT INHALER        ALPRAZOLAM (XANAX) 0.5 MG TABLET    Take 1 tablet (0.5 mg) by mouth 3 (three) times daily    ASPIRIN EC 81 MG EC TABLET    Take 1 tablet (81 mg) by mouth daily    CALCIUM CARB-CHOLECALCIFEROL (OS-CAL CALCIUM + D3) 500-5 MG-MCG TAB    TAKE 1 TAB BY MOUTH TWICE A DAY    CYANOCOBALAMIN (B-12) 2500 MCG TAB    DISSOLVE 1 TABLET BY SUBLIGUAL ROUTE EVERY DAY    GABAPENTIN (NEURONTIN) 800 MG TABLET    TAKE  1 TABLET FOUR TIMES DAILY    LEVOFLOXACIN (LEVAQUIN) 500 MG TABLET        LEVOTHYROXINE (SYNTHROID) 112 MCG TABLET    Take 1 tablet (112 mcg) by mouth every morning    METOPROLOL TARTRATE (LOPRESSOR) 25 MG TABLET    Take 1 tablet (25 mg) by mouth 2 (two) times daily    PREDNISONE (DELTASONE) 20 MG TABLET        PROMETHAZINE-DEXTROMETHORPHAN (PROMETHAZINE-DM) 6.25-15 MG/5ML SYRUP    TAKE 5 MLS BY MOUTH AS NEEDED EVERY 6 HRS FOR 10 DAYS    VITAMIN D3 (CHOLECALCIFEROL) 125 MCG (5000 UT) CAPSULE    Take 1 capsule (125 mcg) by mouth daily         Past History:   Medical: Pt has a past medical history of Atrial fibrillation, Chronic obstructive pulmonary disease, and Hypertension.    Surgical: Pt  has a past surgical history that includes Hysterectomy and Back surgery.    Family: The family history is not on file.    Social: Pt has no history on file for tobacco use, alcohol use, and drug use.      Physical Exam:   Physical Exam  Vitals and nursing note reviewed.   Constitutional:       Appearance: She is obese.   HENT:      Head: Normocephalic and atraumatic.   Cardiovascular:      Rate and Rhythm: Normal rate.      Pulses: Normal pulses.   Chest:       Musculoskeletal:         General: No tenderness. Normal range of motion.   Skin:      General: Skin is warm and dry.      Findings: Bruising present.   Neurological:      General: No focal deficit present.      Mental Status: She is alert and oriented to person, place, and time. Mental status is at baseline.            ED Vital Signs   Vitals:    02/19/22 2023   BP: 170/81   Pulse: 76   Resp: 21   Temp: 98.1 F (36.7 C)   TempSrc: Temporal   SpO2: 92%   Weight: 93.8 kg             MDM:   Medical Decision Making  Differential diagnoses include but not limited to spontaneous bleeding/traumatic contusion of the right breast/breast hematoma.  The breast has black and blue discoloration with mild tenderness on palpation that looks in different healing stages.  There is no evidence of any acute bleed, this happened 3 days prior to this visit.  She does not note any traumatic event or can recall any reason, this was evaluated by her primary care physician and the Eliquis was lowered from 5 mg to 2.5 mg.  Counseled patient regarding conservative treatment.  There is no reason to do any acute intervention at this point.  She does have a mammogram scheduled on December for routine care.  Counseled regarding follow-up with the primary care physician for further evaluation.  Strict return precaution given in case of any other acute symptoms.    Problems Addressed:  Hematoma of right breast: undiagnosed new problem with uncertain prognosis       9:05 PM      Course in ED:             Diagnostic Results:   The results of the  diagnostic studies have been reviewed by myself:    Radiologic Studies  No results found.    Lab Studies  Lab Results    None               EKG:           Procedure / Critical Care time/EKG:   Procedures    Total time providing critical care:     ATTESTATIONS   This chart was generated by an EMR and may contain errors or additions/omissions not intended by the user.           Jennette Bill, MD                 Jennette Bill, MD  02/19/22 2105

## 2022-03-15 DIAGNOSIS — F0150 Vascular dementia without behavioral disturbance: Secondary | ICD-10-CM | POA: Diagnosis not present

## 2022-03-15 DIAGNOSIS — G4733 Obstructive sleep apnea (adult) (pediatric): Secondary | ICD-10-CM | POA: Diagnosis not present

## 2022-03-15 DIAGNOSIS — I4821 Permanent atrial fibrillation: Secondary | ICD-10-CM | POA: Diagnosis not present

## 2022-03-15 DIAGNOSIS — I872 Venous insufficiency (chronic) (peripheral): Secondary | ICD-10-CM | POA: Diagnosis not present

## 2022-03-15 DIAGNOSIS — J449 Chronic obstructive pulmonary disease, unspecified: Secondary | ICD-10-CM | POA: Diagnosis not present

## 2022-03-15 DIAGNOSIS — I5032 Chronic diastolic (congestive) heart failure: Secondary | ICD-10-CM | POA: Diagnosis not present

## 2022-03-15 DIAGNOSIS — Z6837 Body mass index (BMI) 37.0-37.9, adult: Secondary | ICD-10-CM | POA: Diagnosis not present

## 2022-03-15 DIAGNOSIS — M797 Fibromyalgia: Secondary | ICD-10-CM | POA: Diagnosis not present

## 2022-03-15 DIAGNOSIS — Z0000 Encounter for general adult medical examination without abnormal findings: Secondary | ICD-10-CM | POA: Diagnosis not present

## 2022-04-15 DIAGNOSIS — G4733 Obstructive sleep apnea (adult) (pediatric): Secondary | ICD-10-CM | POA: Diagnosis not present

## 2022-04-21 DIAGNOSIS — Z79899 Other long term (current) drug therapy: Secondary | ICD-10-CM | POA: Diagnosis not present

## 2022-04-21 DIAGNOSIS — Z7951 Long term (current) use of inhaled steroids: Secondary | ICD-10-CM | POA: Diagnosis not present

## 2022-04-21 DIAGNOSIS — I10 Essential (primary) hypertension: Secondary | ICD-10-CM | POA: Diagnosis not present

## 2022-04-21 DIAGNOSIS — R442 Other hallucinations: Secondary | ICD-10-CM | POA: Diagnosis not present

## 2022-04-21 DIAGNOSIS — I4891 Unspecified atrial fibrillation: Secondary | ICD-10-CM | POA: Diagnosis not present

## 2022-04-21 DIAGNOSIS — M5432 Sciatica, left side: Secondary | ICD-10-CM | POA: Diagnosis not present

## 2022-04-21 DIAGNOSIS — J449 Chronic obstructive pulmonary disease, unspecified: Secondary | ICD-10-CM | POA: Diagnosis not present

## 2022-04-21 DIAGNOSIS — E119 Type 2 diabetes mellitus without complications: Secondary | ICD-10-CM | POA: Diagnosis not present

## 2022-04-21 DIAGNOSIS — E039 Hypothyroidism, unspecified: Secondary | ICD-10-CM | POA: Diagnosis not present

## 2022-05-15 DIAGNOSIS — G4733 Obstructive sleep apnea (adult) (pediatric): Secondary | ICD-10-CM | POA: Diagnosis not present
